# Patient Record
Sex: Female | Born: 1960 | Race: White | Hispanic: No | Marital: Married | Smoking: Never smoker
Health system: Southern US, Community
[De-identification: ages and names within clinical notes are randomized; demographics above are authoritative.]

## PROBLEM LIST (undated history)

## (undated) DIAGNOSIS — M329 Systemic lupus erythematosus, unspecified: Secondary | ICD-10-CM

## (undated) DIAGNOSIS — I10 Essential (primary) hypertension: Secondary | ICD-10-CM

## (undated) DIAGNOSIS — R05 Cough: Secondary | ICD-10-CM

## (undated) DIAGNOSIS — T148XXA Other injury of unspecified body region, initial encounter: Secondary | ICD-10-CM

## (undated) DIAGNOSIS — I509 Heart failure, unspecified: Secondary | ICD-10-CM

## (undated) DIAGNOSIS — R053 Chronic cough: Secondary | ICD-10-CM

## (undated) DIAGNOSIS — I609 Nontraumatic subarachnoid hemorrhage, unspecified: Secondary | ICD-10-CM

## (undated) DIAGNOSIS — N189 Chronic kidney disease, unspecified: Secondary | ICD-10-CM

## (undated) DIAGNOSIS — I4891 Unspecified atrial fibrillation: Secondary | ICD-10-CM

## (undated) DIAGNOSIS — E669 Obesity, unspecified: Secondary | ICD-10-CM

## (undated) HISTORY — DX: Nontraumatic subarachnoid hemorrhage, unspecified: I60.9

## (undated) HISTORY — DX: Other injury of unspecified body region, initial encounter: T14.8XXA

## (undated) HISTORY — DX: Obesity, unspecified: E66.9

## (undated) HISTORY — PX: CARDIOVERSION: SHX1299

## (undated) HISTORY — PX: HERNIA REPAIR: SHX51

## (undated) HISTORY — DX: Chronic kidney disease, unspecified: N18.9

## (undated) HISTORY — PX: EYE SURGERY: SHX253

## (undated) HISTORY — DX: Systemic lupus erythematosus, unspecified: M32.9

## (undated) HISTORY — DX: Unspecified atrial fibrillation: I48.91

## (undated) HISTORY — DX: Chronic cough: R05.3

## (undated) HISTORY — DX: Cough: R05

## (undated) HISTORY — DX: Heart failure, unspecified: I50.9

## (undated) HISTORY — DX: Essential (primary) hypertension: I10

## (undated) HISTORY — PX: OTHER SURGICAL HISTORY: SHX169

---

## 1998-02-03 ENCOUNTER — Encounter (HOSPITAL_COMMUNITY): Admission: RE | Admit: 1998-02-03 | Discharge: 1998-05-04 | Payer: Self-pay | Admitting: Nephrology

## 1998-04-26 ENCOUNTER — Ambulatory Visit (HOSPITAL_COMMUNITY): Admission: RE | Admit: 1998-04-26 | Discharge: 1998-04-26 | Payer: Self-pay | Admitting: Nephrology

## 1998-09-29 ENCOUNTER — Emergency Department (HOSPITAL_COMMUNITY): Admission: EM | Admit: 1998-09-29 | Discharge: 1998-09-29 | Payer: Self-pay | Admitting: Emergency Medicine

## 1998-12-31 ENCOUNTER — Encounter (HOSPITAL_COMMUNITY): Admission: RE | Admit: 1998-12-31 | Discharge: 1999-03-31 | Payer: Self-pay | Admitting: Nephrology

## 1999-03-20 ENCOUNTER — Encounter: Admission: RE | Admit: 1999-03-20 | Discharge: 1999-03-20 | Payer: Self-pay | Admitting: Nephrology

## 1999-03-25 ENCOUNTER — Encounter: Payer: Self-pay | Admitting: General Surgery

## 1999-03-25 ENCOUNTER — Ambulatory Visit (HOSPITAL_COMMUNITY): Admission: RE | Admit: 1999-03-25 | Discharge: 1999-03-25 | Payer: Self-pay | Admitting: *Deleted

## 1999-03-26 ENCOUNTER — Ambulatory Visit (HOSPITAL_COMMUNITY): Admission: RE | Admit: 1999-03-26 | Discharge: 1999-03-26 | Payer: Self-pay | Admitting: General Surgery

## 2000-05-11 ENCOUNTER — Ambulatory Visit (HOSPITAL_COMMUNITY): Admission: RE | Admit: 2000-05-11 | Discharge: 2000-05-11 | Payer: Self-pay | Admitting: Nephrology

## 2001-01-08 ENCOUNTER — Emergency Department (HOSPITAL_COMMUNITY): Admission: EM | Admit: 2001-01-08 | Discharge: 2001-01-08 | Payer: Self-pay | Admitting: *Deleted

## 2001-01-08 ENCOUNTER — Encounter: Payer: Self-pay | Admitting: Emergency Medicine

## 2003-07-11 ENCOUNTER — Inpatient Hospital Stay (HOSPITAL_COMMUNITY): Admission: AD | Admit: 2003-07-11 | Discharge: 2003-07-17 | Payer: Self-pay | Admitting: Nephrology

## 2003-07-12 ENCOUNTER — Encounter: Payer: Self-pay | Admitting: Cardiology

## 2004-02-23 ENCOUNTER — Emergency Department (HOSPITAL_COMMUNITY): Admission: EM | Admit: 2004-02-23 | Discharge: 2004-02-23 | Payer: Self-pay | Admitting: Emergency Medicine

## 2004-08-03 ENCOUNTER — Inpatient Hospital Stay (HOSPITAL_COMMUNITY): Admission: EM | Admit: 2004-08-03 | Discharge: 2004-08-08 | Payer: Self-pay | Admitting: Emergency Medicine

## 2004-08-04 ENCOUNTER — Ambulatory Visit: Payer: Self-pay | Admitting: Internal Medicine

## 2004-08-05 ENCOUNTER — Encounter: Payer: Self-pay | Admitting: Cardiology

## 2004-08-10 ENCOUNTER — Inpatient Hospital Stay (HOSPITAL_COMMUNITY): Admission: AD | Admit: 2004-08-10 | Discharge: 2004-08-13 | Payer: Self-pay | Admitting: Internal Medicine

## 2004-08-10 ENCOUNTER — Ambulatory Visit: Payer: Self-pay | Admitting: Internal Medicine

## 2004-08-17 ENCOUNTER — Ambulatory Visit: Payer: Self-pay | Admitting: Cardiology

## 2004-08-21 ENCOUNTER — Ambulatory Visit: Payer: Self-pay | Admitting: Cardiology

## 2004-08-25 ENCOUNTER — Ambulatory Visit: Payer: Self-pay | Admitting: Internal Medicine

## 2004-09-04 ENCOUNTER — Ambulatory Visit: Payer: Self-pay | Admitting: Cardiology

## 2004-09-17 ENCOUNTER — Ambulatory Visit: Payer: Self-pay | Admitting: Cardiology

## 2004-10-09 ENCOUNTER — Ambulatory Visit: Payer: Self-pay | Admitting: Cardiovascular Disease

## 2004-10-15 ENCOUNTER — Ambulatory Visit: Payer: Self-pay | Admitting: Internal Medicine

## 2004-10-16 ENCOUNTER — Ambulatory Visit (HOSPITAL_COMMUNITY): Admission: RE | Admit: 2004-10-16 | Discharge: 2004-10-16 | Payer: Self-pay | Admitting: Internal Medicine

## 2004-10-19 ENCOUNTER — Ambulatory Visit: Payer: Self-pay | Admitting: Internal Medicine

## 2004-10-23 ENCOUNTER — Ambulatory Visit: Payer: Self-pay

## 2004-10-30 ENCOUNTER — Ambulatory Visit: Payer: Self-pay | Admitting: Cardiology

## 2004-11-06 ENCOUNTER — Ambulatory Visit: Payer: Self-pay | Admitting: *Deleted

## 2004-11-13 ENCOUNTER — Ambulatory Visit: Payer: Self-pay | Admitting: Internal Medicine

## 2004-11-16 ENCOUNTER — Ambulatory Visit: Payer: Self-pay | Admitting: Internal Medicine

## 2004-12-07 ENCOUNTER — Ambulatory Visit: Payer: Self-pay | Admitting: Cardiology

## 2005-01-04 ENCOUNTER — Ambulatory Visit: Payer: Self-pay | Admitting: Cardiology

## 2005-01-18 ENCOUNTER — Ambulatory Visit: Payer: Self-pay | Admitting: Cardiology

## 2005-01-26 ENCOUNTER — Ambulatory Visit: Payer: Self-pay | Admitting: Internal Medicine

## 2005-01-28 ENCOUNTER — Ambulatory Visit: Payer: Self-pay | Admitting: Internal Medicine

## 2005-02-24 ENCOUNTER — Ambulatory Visit: Payer: Self-pay | Admitting: Cardiology

## 2005-03-08 ENCOUNTER — Ambulatory Visit: Payer: Self-pay | Admitting: Cardiology

## 2005-03-22 ENCOUNTER — Ambulatory Visit: Payer: Self-pay | Admitting: Internal Medicine

## 2005-04-01 ENCOUNTER — Ambulatory Visit: Payer: Self-pay | Admitting: Internal Medicine

## 2005-04-02 ENCOUNTER — Ambulatory Visit: Payer: Self-pay | Admitting: Cardiology

## 2005-04-12 ENCOUNTER — Ambulatory Visit: Payer: Self-pay | Admitting: Internal Medicine

## 2005-04-26 ENCOUNTER — Ambulatory Visit: Payer: Self-pay | Admitting: Cardiology

## 2005-04-27 ENCOUNTER — Emergency Department (HOSPITAL_COMMUNITY): Admission: EM | Admit: 2005-04-27 | Discharge: 2005-04-27 | Payer: Self-pay | Admitting: Emergency Medicine

## 2005-05-17 ENCOUNTER — Ambulatory Visit: Payer: Self-pay | Admitting: Cardiology

## 2005-06-03 ENCOUNTER — Ambulatory Visit: Payer: Self-pay | Admitting: *Deleted

## 2005-06-05 ENCOUNTER — Inpatient Hospital Stay (HOSPITAL_COMMUNITY): Admission: EM | Admit: 2005-06-05 | Discharge: 2005-06-07 | Payer: Self-pay | Admitting: Emergency Medicine

## 2005-06-17 ENCOUNTER — Ambulatory Visit: Payer: Self-pay | Admitting: Cardiology

## 2005-06-24 ENCOUNTER — Ambulatory Visit: Payer: Self-pay | Admitting: Cardiology

## 2005-07-01 ENCOUNTER — Ambulatory Visit: Payer: Self-pay | Admitting: Cardiology

## 2005-07-15 ENCOUNTER — Ambulatory Visit: Payer: Self-pay | Admitting: *Deleted

## 2005-07-30 ENCOUNTER — Ambulatory Visit: Payer: Self-pay | Admitting: Internal Medicine

## 2005-08-27 ENCOUNTER — Ambulatory Visit: Payer: Self-pay | Admitting: Cardiovascular Disease

## 2005-09-03 ENCOUNTER — Ambulatory Visit: Payer: Self-pay | Admitting: Cardiovascular Disease

## 2005-09-17 ENCOUNTER — Ambulatory Visit: Payer: Self-pay | Admitting: Internal Medicine

## 2005-09-17 ENCOUNTER — Ambulatory Visit: Payer: Self-pay | Admitting: Cardiology

## 2005-09-24 ENCOUNTER — Ambulatory Visit: Payer: Self-pay | Admitting: Cardiology

## 2005-10-04 ENCOUNTER — Ambulatory Visit: Payer: Self-pay | Admitting: Cardiology

## 2005-10-15 ENCOUNTER — Ambulatory Visit: Payer: Self-pay | Admitting: Internal Medicine

## 2005-11-16 ENCOUNTER — Ambulatory Visit: Payer: Self-pay | Admitting: *Deleted

## 2005-11-25 ENCOUNTER — Ambulatory Visit: Payer: Self-pay | Admitting: Cardiology

## 2005-12-09 ENCOUNTER — Ambulatory Visit: Payer: Self-pay | Admitting: Cardiology

## 2005-12-27 ENCOUNTER — Ambulatory Visit: Payer: Self-pay | Admitting: Cardiology

## 2006-02-01 ENCOUNTER — Ambulatory Visit: Payer: Self-pay | Admitting: *Deleted

## 2006-03-09 ENCOUNTER — Ambulatory Visit: Payer: Self-pay | Admitting: Internal Medicine

## 2006-04-06 ENCOUNTER — Ambulatory Visit: Payer: Self-pay | Admitting: Cardiology

## 2006-04-21 ENCOUNTER — Ambulatory Visit: Payer: Self-pay | Admitting: Internal Medicine

## 2006-04-27 ENCOUNTER — Ambulatory Visit: Payer: Self-pay | Admitting: Cardiology

## 2006-05-09 ENCOUNTER — Ambulatory Visit: Payer: Self-pay | Admitting: Internal Medicine

## 2006-06-01 ENCOUNTER — Ambulatory Visit: Payer: Self-pay | Admitting: Cardiology

## 2006-06-30 ENCOUNTER — Encounter (HOSPITAL_COMMUNITY): Admission: RE | Admit: 2006-06-30 | Discharge: 2006-09-20 | Payer: Self-pay | Admitting: Nephrology

## 2006-07-06 ENCOUNTER — Ambulatory Visit: Payer: Self-pay | Admitting: Internal Medicine

## 2006-07-15 ENCOUNTER — Ambulatory Visit: Payer: Self-pay | Admitting: *Deleted

## 2006-07-29 ENCOUNTER — Ambulatory Visit: Payer: Self-pay | Admitting: Internal Medicine

## 2006-08-30 ENCOUNTER — Ambulatory Visit: Payer: Self-pay | Admitting: Cardiology

## 2006-10-03 ENCOUNTER — Ambulatory Visit: Payer: Self-pay | Admitting: Cardiology

## 2006-11-07 ENCOUNTER — Ambulatory Visit: Payer: Self-pay | Admitting: Internal Medicine

## 2006-11-21 ENCOUNTER — Ambulatory Visit: Payer: Self-pay | Admitting: Cardiology

## 2006-12-12 ENCOUNTER — Ambulatory Visit: Payer: Self-pay | Admitting: Cardiovascular Disease

## 2006-12-26 ENCOUNTER — Ambulatory Visit: Payer: Self-pay | Admitting: Cardiology

## 2007-01-20 ENCOUNTER — Ambulatory Visit: Payer: Self-pay | Admitting: Cardiology

## 2007-02-03 ENCOUNTER — Ambulatory Visit: Payer: Self-pay | Admitting: Internal Medicine

## 2007-02-10 ENCOUNTER — Ambulatory Visit: Payer: Self-pay | Admitting: Internal Medicine

## 2007-02-14 ENCOUNTER — Ambulatory Visit: Payer: Self-pay | Admitting: Internal Medicine

## 2007-02-14 LAB — CONVERTED CEMR LAB
BUN: 35 mg/dL — ABNORMAL HIGH (ref 6–23)
Basophils Absolute: 0 10*3/uL (ref 0.0–0.1)
CO2: 25 meq/L (ref 19–32)
Chloride: 109 meq/L (ref 96–112)
Creatinine, Ser: 2.1 mg/dL — ABNORMAL HIGH (ref 0.4–1.2)
GFR calc Af Amer: 33 mL/min
GFR calc non Af Amer: 27 mL/min
Glucose, Bld: 108 mg/dL — ABNORMAL HIGH (ref 70–99)
HCT: 29.2 % — ABNORMAL LOW (ref 36.0–46.0)
INR: 3.4 — ABNORMAL HIGH (ref 0.8–1.0)
MCHC: 32.6 g/dL (ref 30.0–36.0)
MCV: 90.8 fL (ref 78.0–100.0)
Magnesium: 1.9 mg/dL (ref 1.5–2.5)
Monocytes Relative: 9.4 % (ref 3.0–11.0)
Neutro Abs: 11.3 10*3/uL — ABNORMAL HIGH (ref 1.4–7.7)
Neutrophils Relative %: 80.2 % — ABNORMAL HIGH (ref 43.0–77.0)
Platelets: 285 10*3/uL (ref 150–400)
Potassium: 4.2 meq/L (ref 3.5–5.1)
Prothrombin Time: 23.4 s — ABNORMAL HIGH (ref 10.9–13.3)
RBC: 3.22 M/uL — ABNORMAL LOW (ref 3.87–5.11)
Sodium: 141 meq/L (ref 135–145)

## 2007-02-15 ENCOUNTER — Ambulatory Visit (HOSPITAL_COMMUNITY): Admission: RE | Admit: 2007-02-15 | Discharge: 2007-02-15 | Payer: Self-pay | Admitting: Internal Medicine

## 2007-02-15 ENCOUNTER — Ambulatory Visit: Payer: Self-pay | Admitting: Internal Medicine

## 2007-02-27 ENCOUNTER — Ambulatory Visit: Payer: Self-pay | Admitting: Internal Medicine

## 2007-04-10 ENCOUNTER — Ambulatory Visit: Payer: Self-pay | Admitting: Cardiovascular Disease

## 2007-05-08 ENCOUNTER — Ambulatory Visit: Payer: Self-pay | Admitting: Internal Medicine

## 2007-06-05 ENCOUNTER — Ambulatory Visit: Payer: Self-pay | Admitting: Cardiology

## 2007-07-07 ENCOUNTER — Ambulatory Visit: Payer: Self-pay | Admitting: Cardiology

## 2007-08-03 ENCOUNTER — Ambulatory Visit: Payer: Self-pay | Admitting: Cardiology

## 2007-08-17 ENCOUNTER — Ambulatory Visit: Payer: Self-pay | Admitting: Cardiology

## 2007-08-31 ENCOUNTER — Ambulatory Visit: Payer: Self-pay | Admitting: Internal Medicine

## 2007-08-31 LAB — CONVERTED CEMR LAB
BUN: 43 mg/dL — ABNORMAL HIGH (ref 6–23)
Basophils Relative: 2.2 % — ABNORMAL HIGH (ref 0.0–1.0)
GFR calc Af Amer: 30 mL/min
GFR calc non Af Amer: 24 mL/min
HCT: 34.7 % — ABNORMAL LOW (ref 36.0–46.0)
Lymphocytes Relative: 10 % — ABNORMAL LOW (ref 12.0–46.0)
MCHC: 32.2 g/dL (ref 30.0–36.0)
Neutro Abs: 14 10*3/uL — ABNORMAL HIGH (ref 1.4–7.7)
Neutrophils Relative %: 86.7 % — ABNORMAL HIGH (ref 43.0–77.0)
Platelets: 222 10*3/uL (ref 150–400)
Potassium: 4.9 meq/L (ref 3.5–5.1)
Sodium: 142 meq/L (ref 135–145)
WBC: 16.2 10*3/uL — ABNORMAL HIGH (ref 4.5–10.5)

## 2007-09-01 ENCOUNTER — Ambulatory Visit (HOSPITAL_COMMUNITY): Admission: RE | Admit: 2007-09-01 | Discharge: 2007-09-01 | Payer: Self-pay | Admitting: Internal Medicine

## 2007-09-01 ENCOUNTER — Ambulatory Visit: Payer: Self-pay | Admitting: Internal Medicine

## 2007-09-14 ENCOUNTER — Ambulatory Visit: Payer: Self-pay | Admitting: Internal Medicine

## 2007-10-12 ENCOUNTER — Ambulatory Visit: Payer: Self-pay | Admitting: Internal Medicine

## 2007-10-20 ENCOUNTER — Ambulatory Visit: Payer: Self-pay | Admitting: Cardiology

## 2007-10-23 ENCOUNTER — Ambulatory Visit: Payer: Self-pay | Admitting: Internal Medicine

## 2007-10-23 ENCOUNTER — Ambulatory Visit (HOSPITAL_COMMUNITY): Admission: RE | Admit: 2007-10-23 | Discharge: 2007-10-23 | Payer: Self-pay | Admitting: Internal Medicine

## 2007-11-09 ENCOUNTER — Ambulatory Visit: Payer: Self-pay | Admitting: Internal Medicine

## 2007-11-17 ENCOUNTER — Ambulatory Visit: Payer: Self-pay | Admitting: Cardiology

## 2007-11-24 ENCOUNTER — Ambulatory Visit: Payer: Self-pay | Admitting: Internal Medicine

## 2007-11-24 ENCOUNTER — Ambulatory Visit: Payer: Self-pay

## 2007-12-07 ENCOUNTER — Ambulatory Visit: Payer: Self-pay | Admitting: Cardiology

## 2007-12-28 ENCOUNTER — Ambulatory Visit: Payer: Self-pay | Admitting: Internal Medicine

## 2008-01-04 ENCOUNTER — Ambulatory Visit: Payer: Self-pay | Admitting: Cardiology

## 2008-01-04 ENCOUNTER — Encounter: Admission: RE | Admit: 2008-01-04 | Discharge: 2008-01-04 | Payer: Self-pay | Admitting: Nephrology

## 2008-01-06 HISTORY — PX: OTHER SURGICAL HISTORY: SHX169

## 2008-01-18 ENCOUNTER — Ambulatory Visit: Payer: Self-pay | Admitting: Internal Medicine

## 2008-02-02 ENCOUNTER — Ambulatory Visit: Payer: Self-pay

## 2008-02-02 ENCOUNTER — Ambulatory Visit: Payer: Self-pay | Admitting: Cardiology

## 2008-02-08 ENCOUNTER — Ambulatory Visit: Payer: Self-pay | Admitting: Internal Medicine

## 2008-02-16 ENCOUNTER — Ambulatory Visit: Payer: Self-pay | Admitting: Internal Medicine

## 2008-02-26 ENCOUNTER — Ambulatory Visit: Payer: Self-pay | Admitting: Internal Medicine

## 2008-03-14 ENCOUNTER — Emergency Department (HOSPITAL_COMMUNITY): Admission: EM | Admit: 2008-03-14 | Discharge: 2008-03-14 | Payer: Self-pay | Admitting: Family Medicine

## 2008-03-19 ENCOUNTER — Ambulatory Visit: Payer: Self-pay | Admitting: Cardiovascular Disease

## 2008-03-22 ENCOUNTER — Ambulatory Visit: Payer: Self-pay | Admitting: Cardiology

## 2008-03-26 ENCOUNTER — Ambulatory Visit: Payer: Self-pay | Admitting: Internal Medicine

## 2008-04-03 ENCOUNTER — Ambulatory Visit: Payer: Self-pay | Admitting: Cardiology

## 2008-04-17 ENCOUNTER — Ambulatory Visit: Payer: Self-pay | Admitting: Internal Medicine

## 2008-04-30 ENCOUNTER — Ambulatory Visit: Payer: Self-pay | Admitting: Cardiology

## 2008-05-28 ENCOUNTER — Ambulatory Visit: Payer: Self-pay | Admitting: Internal Medicine

## 2008-06-19 ENCOUNTER — Ambulatory Visit: Payer: Self-pay | Admitting: Cardiology

## 2008-07-17 ENCOUNTER — Ambulatory Visit: Payer: Self-pay | Admitting: Cardiology

## 2008-08-05 ENCOUNTER — Ambulatory Visit: Payer: Self-pay | Admitting: Cardiology

## 2008-08-08 ENCOUNTER — Ambulatory Visit: Payer: Self-pay | Admitting: Cardiovascular Disease

## 2008-08-11 ENCOUNTER — Ambulatory Visit (HOSPITAL_COMMUNITY): Admission: RE | Admit: 2008-08-11 | Discharge: 2008-08-11 | Payer: Self-pay | Admitting: Emergency Medicine

## 2008-08-13 ENCOUNTER — Ambulatory Visit: Payer: Self-pay | Admitting: Internal Medicine

## 2008-08-13 ENCOUNTER — Encounter: Payer: Self-pay | Admitting: Internal Medicine

## 2008-08-13 ENCOUNTER — Ambulatory Visit: Payer: Self-pay | Admitting: Cardiology

## 2008-08-13 DIAGNOSIS — I10 Essential (primary) hypertension: Secondary | ICD-10-CM

## 2008-08-13 DIAGNOSIS — R05 Cough: Secondary | ICD-10-CM

## 2008-08-13 DIAGNOSIS — I4891 Unspecified atrial fibrillation: Secondary | ICD-10-CM

## 2008-08-20 ENCOUNTER — Ambulatory Visit: Payer: Self-pay | Admitting: Internal Medicine

## 2008-08-30 ENCOUNTER — Ambulatory Visit: Payer: Self-pay | Admitting: Pulmonary Disease

## 2008-08-30 DIAGNOSIS — M329 Systemic lupus erythematosus, unspecified: Secondary | ICD-10-CM | POA: Insufficient documentation

## 2008-09-12 ENCOUNTER — Ambulatory Visit: Payer: Self-pay | Admitting: Internal Medicine

## 2008-09-12 LAB — CONVERTED CEMR LAB: INR: 13.8 (ref 0.8–1.0)

## 2008-09-13 ENCOUNTER — Encounter: Payer: Self-pay | Admitting: Internal Medicine

## 2008-09-13 ENCOUNTER — Ambulatory Visit: Payer: Self-pay

## 2008-09-16 ENCOUNTER — Ambulatory Visit: Payer: Self-pay | Admitting: Internal Medicine

## 2008-09-16 LAB — CONVERTED CEMR LAB
ALT: 26 units/L (ref 0–35)
AST: 16 units/L (ref 0–37)
Alkaline Phosphatase: 52 units/L (ref 39–117)
BUN: 76 mg/dL — ABNORMAL HIGH (ref 6–23)
Bilirubin, Direct: 0 mg/dL (ref 0.0–0.3)
CO2: 22 meq/L (ref 19–32)
Chloride: 107 meq/L (ref 96–112)
Eosinophils Relative: 0.2 % (ref 0.0–5.0)
GFR calc non Af Amer: 10.65 mL/min (ref >60)
Glucose, Bld: 77 mg/dL (ref 70–99)
HCT: 26.8 % — ABNORMAL LOW (ref 36.0–46.0)
Hemoglobin: 8.7 g/dL — ABNORMAL LOW (ref 12.0–15.0)
MCHC: 32.5 g/dL (ref 30.0–36.0)
Monocytes Absolute: 0.5 10*3/uL (ref 0.1–1.0)
Neutro Abs: 8.6 10*3/uL — ABNORMAL HIGH (ref 1.4–7.7)
Neutrophils Relative %: 87.9 % — ABNORMAL HIGH (ref 43.0–77.0)
Platelets: 355 10*3/uL (ref 150.0–400.0)
Potassium: 4.4 meq/L (ref 3.5–5.1)
RBC: 2.95 M/uL — ABNORMAL LOW (ref 3.87–5.11)
RDW: 13.5 % (ref 11.5–14.6)
Total Protein: 7.1 g/dL (ref 6.0–8.3)
WBC: 9.8 10*3/uL (ref 4.5–10.5)

## 2008-09-18 ENCOUNTER — Encounter: Admission: RE | Admit: 2008-09-18 | Discharge: 2008-09-18 | Payer: Self-pay | Admitting: Nephrology

## 2008-09-20 ENCOUNTER — Ambulatory Visit: Payer: Self-pay | Admitting: Cardiology

## 2008-09-25 ENCOUNTER — Ambulatory Visit: Payer: Self-pay | Admitting: Pulmonary Disease

## 2008-09-27 ENCOUNTER — Ambulatory Visit: Payer: Self-pay | Admitting: Cardiology

## 2008-10-02 ENCOUNTER — Encounter: Payer: Self-pay | Admitting: Internal Medicine

## 2008-10-09 ENCOUNTER — Ambulatory Visit: Payer: Self-pay | Admitting: Cardiology

## 2008-10-16 ENCOUNTER — Encounter: Payer: Self-pay | Admitting: Internal Medicine

## 2008-10-16 ENCOUNTER — Ambulatory Visit: Payer: Self-pay | Admitting: Cardiology

## 2008-10-17 ENCOUNTER — Encounter (INDEPENDENT_AMBULATORY_CARE_PROVIDER_SITE_OTHER): Payer: Self-pay

## 2008-10-17 LAB — CONVERTED CEMR LAB
BUN: 32 mg/dL
Creatinine, Ser: 1.96 mg/dL
Potassium: 3.9 meq/L

## 2008-10-29 ENCOUNTER — Encounter (INDEPENDENT_AMBULATORY_CARE_PROVIDER_SITE_OTHER): Payer: Self-pay

## 2008-10-29 ENCOUNTER — Ambulatory Visit: Payer: Self-pay | Admitting: Cardiology

## 2008-11-01 ENCOUNTER — Encounter (INDEPENDENT_AMBULATORY_CARE_PROVIDER_SITE_OTHER): Payer: Self-pay | Admitting: *Deleted

## 2008-11-05 ENCOUNTER — Encounter: Payer: Self-pay | Admitting: *Deleted

## 2008-11-06 ENCOUNTER — Ambulatory Visit: Payer: Self-pay | Admitting: Cardiology

## 2008-11-06 LAB — CONVERTED CEMR LAB
POC INR: 2.9
Protime: 20.6

## 2008-11-13 ENCOUNTER — Telehealth: Payer: Self-pay | Admitting: Internal Medicine

## 2008-11-13 DIAGNOSIS — N259 Disorder resulting from impaired renal tubular function, unspecified: Secondary | ICD-10-CM | POA: Insufficient documentation

## 2008-11-20 ENCOUNTER — Encounter (INDEPENDENT_AMBULATORY_CARE_PROVIDER_SITE_OTHER): Payer: Self-pay | Admitting: Cardiology

## 2008-11-20 ENCOUNTER — Ambulatory Visit: Payer: Self-pay | Admitting: Internal Medicine

## 2008-12-11 ENCOUNTER — Encounter: Payer: Self-pay | Admitting: *Deleted

## 2008-12-12 ENCOUNTER — Ambulatory Visit: Payer: Self-pay | Admitting: Cardiology

## 2008-12-12 LAB — CONVERTED CEMR LAB
POC INR: 5.2
Prothrombin Time: 27.7 s

## 2008-12-18 ENCOUNTER — Ambulatory Visit: Payer: Self-pay

## 2008-12-18 ENCOUNTER — Encounter: Payer: Self-pay | Admitting: Internal Medicine

## 2008-12-18 ENCOUNTER — Ambulatory Visit: Payer: Self-pay | Admitting: Cardiology

## 2008-12-18 LAB — CONVERTED CEMR LAB: POC INR: 2.3

## 2008-12-27 ENCOUNTER — Telehealth (INDEPENDENT_AMBULATORY_CARE_PROVIDER_SITE_OTHER): Payer: Self-pay | Admitting: *Deleted

## 2009-01-01 ENCOUNTER — Ambulatory Visit: Payer: Self-pay | Admitting: Cardiovascular Disease

## 2009-01-01 LAB — CONVERTED CEMR LAB
POC INR: 2.1
Prothrombin Time: 21.8 s
Prothrombin Time: 21.8 s — ABNORMAL HIGH (ref 10.9–13.3)

## 2009-01-06 ENCOUNTER — Encounter: Payer: Self-pay | Admitting: Internal Medicine

## 2009-01-14 ENCOUNTER — Encounter: Payer: Self-pay | Admitting: Cardiology

## 2009-02-14 ENCOUNTER — Ambulatory Visit: Payer: Self-pay | Admitting: Internal Medicine

## 2009-03-04 ENCOUNTER — Ambulatory Visit: Payer: Self-pay | Admitting: Internal Medicine

## 2009-03-11 ENCOUNTER — Encounter: Payer: Self-pay | Admitting: Internal Medicine

## 2009-03-13 ENCOUNTER — Telehealth: Payer: Self-pay | Admitting: Internal Medicine

## 2009-03-14 ENCOUNTER — Ambulatory Visit: Payer: Self-pay | Admitting: Internal Medicine

## 2009-03-14 DIAGNOSIS — R5381 Other malaise: Secondary | ICD-10-CM

## 2009-03-14 DIAGNOSIS — R5383 Other fatigue: Secondary | ICD-10-CM

## 2009-03-14 LAB — CONVERTED CEMR LAB
Anti Nuclear Antibody(ANA): NEGATIVE
Bilirubin Urine: NEGATIVE
Ketones, ur: NEGATIVE mg/dL
Nitrite: NEGATIVE
Protein, ur: 100 mg/dL — AB
Specific Gravity, Urine: 1.014 (ref 1.005–1.030)

## 2009-03-18 ENCOUNTER — Telehealth: Payer: Self-pay | Admitting: Internal Medicine

## 2009-03-18 ENCOUNTER — Encounter: Payer: Self-pay | Admitting: Internal Medicine

## 2009-03-20 ENCOUNTER — Observation Stay (HOSPITAL_COMMUNITY): Admission: AD | Admit: 2009-03-20 | Discharge: 2009-03-20 | Payer: Self-pay | Admitting: Internal Medicine

## 2009-03-20 LAB — CONVERTED CEMR LAB
Calcium: 9.4 mg/dL (ref 8.4–10.5)
Eosinophils Absolute: 0.1 10*3/uL (ref 0.0–0.7)
Glucose, Bld: 91 mg/dL (ref 70–99)
HCT: 21.2 % — CL (ref 36.0–46.0)
Lymphs Abs: 1.3 10*3/uL (ref 0.7–4.0)
MCHC: 31.9 g/dL (ref 30.0–36.0)
Monocytes Absolute: 0.7 10*3/uL (ref 0.1–1.0)
Neutro Abs: 11.7 10*3/uL — ABNORMAL HIGH (ref 1.4–7.7)
Platelets: 343 10*3/uL (ref 150.0–400.0)
RBC: 2.29 M/uL — ABNORMAL LOW (ref 3.87–5.11)
Sodium: 135 meq/L (ref 135–145)
T3, Free: 1.9 pg/mL — ABNORMAL LOW (ref 2.3–4.2)
WBC: 13.8 10*3/uL — ABNORMAL HIGH (ref 4.5–10.5)

## 2009-03-25 ENCOUNTER — Encounter: Payer: Self-pay | Admitting: Internal Medicine

## 2009-04-02 ENCOUNTER — Telehealth: Payer: Self-pay | Admitting: Internal Medicine

## 2009-04-15 ENCOUNTER — Encounter: Payer: Self-pay | Admitting: Internal Medicine

## 2009-04-29 ENCOUNTER — Telehealth: Payer: Self-pay | Admitting: Internal Medicine

## 2009-05-13 ENCOUNTER — Encounter: Payer: Self-pay | Admitting: Internal Medicine

## 2009-05-16 ENCOUNTER — Encounter: Payer: Self-pay | Admitting: Internal Medicine

## 2009-05-20 ENCOUNTER — Telehealth: Payer: Self-pay | Admitting: Internal Medicine

## 2009-05-24 ENCOUNTER — Inpatient Hospital Stay (HOSPITAL_COMMUNITY): Admission: EM | Admit: 2009-05-24 | Discharge: 2009-05-30 | Payer: Self-pay | Admitting: Family Medicine

## 2009-05-24 ENCOUNTER — Ambulatory Visit: Payer: Self-pay | Admitting: Family Medicine

## 2009-05-24 ENCOUNTER — Ambulatory Visit: Payer: Self-pay | Admitting: Internal Medicine

## 2009-05-28 ENCOUNTER — Encounter: Payer: Self-pay | Admitting: Internal Medicine

## 2009-06-02 ENCOUNTER — Ambulatory Visit: Payer: Self-pay | Admitting: Internal Medicine

## 2009-06-02 LAB — CONVERTED CEMR LAB: POC INR: 3.1

## 2009-06-03 LAB — CONVERTED CEMR LAB
BUN: 42 mg/dL — ABNORMAL HIGH (ref 6–23)
CO2: 21 meq/L (ref 19–32)
Calcium: 9.7 mg/dL (ref 8.4–10.5)
Chloride: 106 meq/L (ref 96–112)
Creatinine, Ser: 2.6 mg/dL — ABNORMAL HIGH (ref 0.4–1.2)
Iron: 78 ug/dL (ref 42–145)
Potassium: 3.3 meq/L — ABNORMAL LOW (ref 3.5–5.1)
Saturation Ratios: 45.3 % (ref 20.0–50.0)
Sodium: 137 meq/L (ref 135–145)

## 2009-06-04 ENCOUNTER — Ambulatory Visit: Payer: Self-pay | Admitting: Cardiology

## 2009-06-04 ENCOUNTER — Encounter (INDEPENDENT_AMBULATORY_CARE_PROVIDER_SITE_OTHER): Payer: Self-pay | Admitting: Cardiology

## 2009-06-04 LAB — CONVERTED CEMR LAB: POC INR: 2.2

## 2009-06-07 HISTORY — PX: OTHER SURGICAL HISTORY: SHX169

## 2009-06-09 ENCOUNTER — Inpatient Hospital Stay (HOSPITAL_COMMUNITY): Admission: EM | Admit: 2009-06-09 | Discharge: 2009-07-05 | Payer: Self-pay | Admitting: Emergency Medicine

## 2009-06-09 ENCOUNTER — Ambulatory Visit: Payer: Self-pay | Admitting: Cardiology

## 2009-06-09 ENCOUNTER — Ambulatory Visit: Payer: Self-pay | Admitting: Cardiovascular Disease

## 2009-06-10 ENCOUNTER — Encounter: Payer: Self-pay | Admitting: Internal Medicine

## 2009-06-11 ENCOUNTER — Ambulatory Visit: Payer: Self-pay | Admitting: Internal Medicine

## 2009-06-11 ENCOUNTER — Ambulatory Visit: Payer: Self-pay | Admitting: Vascular Surgery

## 2009-07-03 ENCOUNTER — Encounter: Payer: Self-pay | Admitting: Internal Medicine

## 2009-07-16 ENCOUNTER — Encounter: Payer: Self-pay | Admitting: Internal Medicine

## 2009-07-31 ENCOUNTER — Telehealth (INDEPENDENT_AMBULATORY_CARE_PROVIDER_SITE_OTHER): Payer: Self-pay | Admitting: *Deleted

## 2009-07-31 ENCOUNTER — Encounter: Payer: Self-pay | Admitting: Internal Medicine

## 2009-08-01 ENCOUNTER — Ambulatory Visit (HOSPITAL_COMMUNITY): Admission: RE | Admit: 2009-08-01 | Discharge: 2009-08-01 | Payer: Self-pay | Admitting: Plastic Surgery

## 2009-08-13 ENCOUNTER — Ambulatory Visit: Payer: Self-pay | Admitting: Internal Medicine

## 2009-08-27 ENCOUNTER — Inpatient Hospital Stay (HOSPITAL_COMMUNITY): Admission: RE | Admit: 2009-08-27 | Discharge: 2009-09-01 | Payer: Self-pay | Admitting: Plastic Surgery

## 2009-09-17 ENCOUNTER — Encounter: Payer: Self-pay | Admitting: Internal Medicine

## 2009-10-08 ENCOUNTER — Encounter: Payer: Self-pay | Admitting: Internal Medicine

## 2009-10-29 ENCOUNTER — Encounter: Payer: Self-pay | Admitting: Internal Medicine

## 2009-12-12 ENCOUNTER — Telehealth (INDEPENDENT_AMBULATORY_CARE_PROVIDER_SITE_OTHER): Payer: Self-pay | Admitting: *Deleted

## 2010-01-19 ENCOUNTER — Ambulatory Visit: Payer: Self-pay | Admitting: Internal Medicine

## 2010-01-19 DIAGNOSIS — R609 Edema, unspecified: Secondary | ICD-10-CM

## 2010-02-16 ENCOUNTER — Encounter: Payer: Self-pay | Admitting: Internal Medicine

## 2010-02-19 ENCOUNTER — Encounter: Payer: Self-pay | Admitting: Internal Medicine

## 2010-02-26 ENCOUNTER — Ambulatory Visit: Payer: Self-pay

## 2010-02-26 ENCOUNTER — Encounter: Payer: Self-pay | Admitting: Cardiovascular Disease

## 2010-04-10 ENCOUNTER — Encounter: Admission: RE | Admit: 2010-04-10 | Discharge: 2010-04-10 | Payer: Self-pay | Admitting: Otolaryngology

## 2010-05-21 ENCOUNTER — Encounter: Payer: Self-pay | Admitting: Internal Medicine

## 2010-06-08 ENCOUNTER — Inpatient Hospital Stay (HOSPITAL_COMMUNITY)
Admission: EM | Admit: 2010-06-08 | Discharge: 2010-06-15 | Payer: Self-pay | Source: Home / Self Care | Attending: Internal Medicine | Admitting: Internal Medicine

## 2010-06-10 LAB — COMPREHENSIVE METABOLIC PANEL
ALT: 14 U/L (ref 0–35)
AST: 19 U/L (ref 0–37)
Albumin: 2.6 g/dL — ABNORMAL LOW (ref 3.5–5.2)
Alkaline Phosphatase: 61 U/L (ref 39–117)
BUN: 59 mg/dL — ABNORMAL HIGH (ref 6–23)
CO2: 23 mEq/L (ref 19–32)
Calcium: 10 mg/dL (ref 8.4–10.5)
Chloride: 109 mEq/L (ref 96–112)
Creatinine, Ser: 3.06 mg/dL — ABNORMAL HIGH (ref 0.4–1.2)
GFR calc Af Amer: 20 mL/min — ABNORMAL LOW (ref >60)
GFR calc non Af Amer: 16 mL/min — ABNORMAL LOW (ref >60)
Glucose, Bld: 101 mg/dL — ABNORMAL HIGH (ref 70–99)
Potassium: 3.9 mEq/L (ref 3.5–5.1)
Sodium: 142 mEq/L (ref 135–145)
Total Bilirubin: 0.5 mg/dL (ref 0.3–1.2)
Total Protein: 6.1 g/dL (ref 6.0–8.3)

## 2010-06-10 LAB — MAGNESIUM: Magnesium: 2.3 mg/dL (ref 1.5–2.5)

## 2010-06-10 LAB — CBC
HCT: 27.6 % — ABNORMAL LOW (ref 36.0–46.0)
Hemoglobin: 8.7 g/dL — ABNORMAL LOW (ref 12.0–15.0)
MCH: 29.9 pg (ref 26.0–34.0)
MCHC: 31.5 g/dL (ref 30.0–36.0)
MCV: 94.8 fL (ref 78.0–100.0)
Platelets: 282 10*3/uL (ref 150–400)
RBC: 2.91 MIL/uL — ABNORMAL LOW (ref 3.87–5.11)
RDW: 14.2 % (ref 11.5–15.5)
WBC: 12.5 10*3/uL — ABNORMAL HIGH (ref 4.0–10.5)

## 2010-06-10 LAB — BRAIN NATRIURETIC PEPTIDE: Pro B Natriuretic peptide (BNP): 553 pg/mL — ABNORMAL HIGH (ref 0.0–100.0)

## 2010-06-10 LAB — PHOSPHORUS: Phosphorus: 4 mg/dL (ref 2.3–4.6)

## 2010-06-11 LAB — COMPREHENSIVE METABOLIC PANEL
ALT: 14 U/L (ref 0–35)
AST: 11 U/L (ref 0–37)
Albumin: 2.8 g/dL — ABNORMAL LOW (ref 3.5–5.2)
Alkaline Phosphatase: 61 U/L (ref 39–117)
BUN: 56 mg/dL — ABNORMAL HIGH (ref 6–23)
CO2: 23 mEq/L (ref 19–32)
Calcium: 9.7 mg/dL (ref 8.4–10.5)
Chloride: 109 mEq/L (ref 96–112)
Creatinine, Ser: 3.11 mg/dL — ABNORMAL HIGH (ref 0.4–1.2)
GFR calc Af Amer: 19 mL/min — ABNORMAL LOW (ref >60)
GFR calc non Af Amer: 16 mL/min — ABNORMAL LOW (ref >60)
Glucose, Bld: 112 mg/dL — ABNORMAL HIGH (ref 70–99)
Potassium: 3.5 mEq/L (ref 3.5–5.1)
Sodium: 141 mEq/L (ref 135–145)
Total Bilirubin: 0.5 mg/dL (ref 0.3–1.2)
Total Protein: 6 g/dL (ref 6.0–8.3)

## 2010-06-11 LAB — BRAIN NATRIURETIC PEPTIDE: Pro B Natriuretic peptide (BNP): 1071 pg/mL — ABNORMAL HIGH (ref 0.0–100.0)

## 2010-06-11 LAB — CBC
HCT: 27 % — ABNORMAL LOW (ref 36.0–46.0)
Hemoglobin: 8.3 g/dL — ABNORMAL LOW (ref 12.0–15.0)
MCH: 29.1 pg (ref 26.0–34.0)
MCHC: 30.7 g/dL (ref 30.0–36.0)
MCV: 94.7 fL (ref 78.0–100.0)
Platelets: 262 10*3/uL (ref 150–400)
RBC: 2.85 MIL/uL — ABNORMAL LOW (ref 3.87–5.11)
RDW: 14.2 % (ref 11.5–15.5)
WBC: 10.8 10*3/uL — ABNORMAL HIGH (ref 4.0–10.5)

## 2010-06-11 LAB — MAGNESIUM: Magnesium: 2.2 mg/dL (ref 1.5–2.5)

## 2010-06-12 LAB — DIFFERENTIAL
Basophils Absolute: 0 10*3/uL (ref 0.0–0.1)
Basophils Relative: 0 % (ref 0–1)
Eosinophils Absolute: 0.1 10*3/uL (ref 0.0–0.7)
Eosinophils Relative: 1 % (ref 0–5)
Lymphocytes Relative: 6 % — ABNORMAL LOW (ref 12–46)
Lymphs Abs: 0.6 10*3/uL — ABNORMAL LOW (ref 0.7–4.0)
Monocytes Absolute: 0.8 10*3/uL (ref 0.1–1.0)
Monocytes Relative: 8 % (ref 3–12)
Neutro Abs: 9.4 10*3/uL — ABNORMAL HIGH (ref 1.7–7.7)
Neutrophils Relative %: 86 % — ABNORMAL HIGH (ref 43–77)

## 2010-06-12 LAB — COMPREHENSIVE METABOLIC PANEL
ALT: 14 U/L (ref 0–35)
AST: 14 U/L (ref 0–37)
Albumin: 2.9 g/dL — ABNORMAL LOW (ref 3.5–5.2)
Alkaline Phosphatase: 64 U/L (ref 39–117)
BUN: 52 mg/dL — ABNORMAL HIGH (ref 6–23)
CO2: 17 mEq/L — ABNORMAL LOW (ref 19–32)
Calcium: 10 mg/dL (ref 8.4–10.5)
Chloride: 108 mEq/L (ref 96–112)
Creatinine, Ser: 3.2 mg/dL — ABNORMAL HIGH (ref 0.4–1.2)
GFR calc Af Amer: 19 mL/min — ABNORMAL LOW (ref >60)
GFR calc non Af Amer: 16 mL/min — ABNORMAL LOW (ref >60)
Glucose, Bld: 95 mg/dL (ref 70–99)
Potassium: 3.7 mEq/L (ref 3.5–5.1)
Sodium: 141 mEq/L (ref 135–145)
Total Bilirubin: 0.5 mg/dL (ref 0.3–1.2)
Total Protein: 6.1 g/dL (ref 6.0–8.3)

## 2010-06-12 LAB — CBC
HCT: 29.5 % — ABNORMAL LOW (ref 36.0–46.0)
HCT: 30 % — ABNORMAL LOW (ref 36.0–46.0)
Hemoglobin: 9 g/dL — ABNORMAL LOW (ref 12.0–15.0)
Hemoglobin: 9.2 g/dL — ABNORMAL LOW (ref 12.0–15.0)
MCH: 29.1 pg (ref 26.0–34.0)
MCH: 29.3 pg (ref 26.0–34.0)
MCHC: 30.5 g/dL (ref 30.0–36.0)
MCHC: 30.7 g/dL (ref 30.0–36.0)
MCV: 95.5 fL (ref 78.0–100.0)
MCV: 95.5 fL (ref 78.0–100.0)
Platelets: 305 10*3/uL (ref 150–400)
Platelets: 307 10*3/uL (ref 150–400)
RBC: 3.09 MIL/uL — ABNORMAL LOW (ref 3.87–5.11)
RBC: 3.14 MIL/uL — ABNORMAL LOW (ref 3.87–5.11)
RDW: 14.3 % (ref 11.5–15.5)
RDW: 14.5 % (ref 11.5–15.5)
WBC: 11 10*3/uL — ABNORMAL HIGH (ref 4.0–10.5)
WBC: 11 10*3/uL — ABNORMAL HIGH (ref 4.0–10.5)

## 2010-06-12 LAB — BRAIN NATRIURETIC PEPTIDE: Pro B Natriuretic peptide (BNP): 1754 pg/mL — ABNORMAL HIGH (ref 0.0–100.0)

## 2010-06-12 LAB — MAGNESIUM: Magnesium: 2.3 mg/dL (ref 1.5–2.5)

## 2010-06-19 ENCOUNTER — Telehealth: Payer: Self-pay | Admitting: Internal Medicine

## 2010-06-22 LAB — BASIC METABOLIC PANEL
BUN: 57 mg/dL — ABNORMAL HIGH (ref 6–23)
BUN: 60 mg/dL — ABNORMAL HIGH (ref 6–23)
CO2: 20 mEq/L (ref 19–32)
CO2: 20 mEq/L (ref 19–32)
Calcium: 9.6 mg/dL (ref 8.4–10.5)
Calcium: 9.4 mg/dL (ref 8.4–10.5)
Chloride: 107 mEq/L (ref 96–112)
Chloride: 108 mEq/L (ref 96–112)
Creatinine, Ser: 3.08 mg/dL — ABNORMAL HIGH (ref 0.4–1.2)
Creatinine, Ser: 3.03 mg/dL — ABNORMAL HIGH (ref 0.4–1.2)
GFR calc Af Amer: 20 mL/min — ABNORMAL LOW (ref >60)
GFR calc Af Amer: 20 mL/min — ABNORMAL LOW (ref >60)
GFR calc non Af Amer: 16 mL/min — ABNORMAL LOW (ref >60)
GFR calc non Af Amer: 17 mL/min — ABNORMAL LOW (ref >60)
Glucose, Bld: 95 mg/dL (ref 70–99)
Glucose, Bld: 106 mg/dL — ABNORMAL HIGH (ref 70–99)
Potassium: 3.5 mEq/L (ref 3.5–5.1)
Potassium: 3.4 mEq/L — ABNORMAL LOW (ref 3.5–5.1)
Sodium: 137 mEq/L (ref 135–145)
Sodium: 138 mEq/L (ref 135–145)

## 2010-06-22 LAB — COMPREHENSIVE METABOLIC PANEL
ALT: 11 U/L (ref 0–35)
AST: 12 U/L (ref 0–37)
Albumin: 2.7 g/dL — ABNORMAL LOW (ref 3.5–5.2)
Alkaline Phosphatase: 63 U/L (ref 39–117)
BUN: 55 mg/dL — ABNORMAL HIGH (ref 6–23)
CO2: 21 mEq/L (ref 19–32)
Calcium: 10.1 mg/dL (ref 8.4–10.5)
Chloride: 107 mEq/L (ref 96–112)
Creatinine, Ser: 3.36 mg/dL — ABNORMAL HIGH (ref 0.4–1.2)
GFR calc Af Amer: 18 mL/min — ABNORMAL LOW (ref >60)
GFR calc non Af Amer: 15 mL/min — ABNORMAL LOW (ref >60)
Glucose, Bld: 109 mg/dL — ABNORMAL HIGH (ref 70–99)
Potassium: 3.4 mEq/L — ABNORMAL LOW (ref 3.5–5.1)
Sodium: 140 mEq/L (ref 135–145)
Total Bilirubin: 0.6 mg/dL (ref 0.3–1.2)
Total Protein: 5.9 g/dL — ABNORMAL LOW (ref 6.0–8.3)

## 2010-06-22 LAB — MAGNESIUM: Magnesium: 2.2 mg/dL (ref 1.5–2.5)

## 2010-06-23 ENCOUNTER — Encounter: Payer: Self-pay | Admitting: Physician Assistant

## 2010-06-23 ENCOUNTER — Ambulatory Visit: Admission: RE | Admit: 2010-06-23 | Discharge: 2010-06-23 | Payer: Self-pay | Source: Home / Self Care

## 2010-06-23 DIAGNOSIS — I5030 Unspecified diastolic (congestive) heart failure: Secondary | ICD-10-CM | POA: Insufficient documentation

## 2010-06-29 ENCOUNTER — Encounter: Payer: Self-pay | Admitting: Internal Medicine

## 2010-07-01 ENCOUNTER — Telehealth: Payer: Self-pay | Admitting: Internal Medicine

## 2010-07-05 LAB — CONVERTED CEMR LAB
ALT: 12 units/L (ref 0–35)
AST: 11 units/L (ref 0–37)
Albumin: 3.5 g/dL (ref 3.5–5.2)
Alkaline Phosphatase: 62 units/L (ref 39–117)
BUN: 50 mg/dL — ABNORMAL HIGH (ref 6–23)
CO2: 25 meq/L (ref 19–32)
Calcium: 9.7 mg/dL (ref 8.4–10.5)
Chloride: 103 meq/L (ref 96–112)
Protime: 20.1
Total Bilirubin: 0.8 mg/dL (ref 0.3–1.2)

## 2010-07-07 ENCOUNTER — Encounter: Payer: Self-pay | Admitting: Internal Medicine

## 2010-07-07 NOTE — Miscellaneous (Signed)
Summary: Orders Update  Clinical Lists Changes  Orders: Added new Test order of Venous Duplex Lower Extremity (Venous Duplex Lower) - Signed 
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
## 2010-07-07 NOTE — Letter (Signed)
Summary: [facility] Patient Note   [facility] Patient Note   Imported By: Roderic Ovens 12/30/2009 16:08:42  _____________________________________________________________________  External Attachment:    Type:   Image     Comment:   External Document

## 2010-07-07 NOTE — Progress Notes (Signed)
Summary: refill request - metoprolol  Phone Note Refill Request Message from:  Patient on December 12, 2009 8:10 AM  Refills Requested: Medication #1:  METOPROLOL SUCCINATE 100 MG XR24H-TAB take one tablet two times a day pt taking med twice a day now, so ran out early, pls call to [facility] [address]   Method Requested: Telephone to Pharmacy Initial call taken by: Glynda Jaeger,  December 12, 2009 8:10 AM    Prescriptions: METOPROLOL SUCCINATE 100 MG XR24H-TAB (METOPROLOL SUCCINATE) take one tablet two times a day  #180 x 2   Entered by:   Hardin Negus, RMA   Authorized by:   Dolores Patty, MD, [facility]   Signed by:   Hardin Negus, RMA on 12/12/2009   Method used:   Electronically to        [id number]* (retail)       [address] [place], [place]  [address]       Ph: [phone]       Fax: [phone]   RxID:   [id number]   Appended Document: refill request - metoprolol    Clinical Lists Changes  Medications: Rx of METOPROLOL SUCCINATE 100 MG XR24H-TAB (METOPROLOL SUCCINATE) take one tablet two times a day;  #180 x 2;  Signed;  Entered by: Hardin Negus, RMA;  Authorized by: Dolores Patty, MD, [facility];  Method used: Electronically to [facility]*, , ,   , Ph: [phone], Fax: [phone]    Prescriptions: METOPROLOL SUCCINATE 100 MG XR24H-TAB (METOPROLOL SUCCINATE) take one tablet two times a day  #180 x 2   Entered by:   Hardin Negus, RMA   Authorized by:   Dolores Patty, MD, [facility]   Signed by:   Hardin Negus, RMA on 12/16/2009   Method used:   Electronically to        [facility]* (retail)             ,          Ph: [phone]       Fax: [phone]   RxID:   [id number]

## 2010-07-07 NOTE — Letter (Signed)
Summary: [facility] Patient Note   [facility] Patient Note   Imported By: Roderic Ovens 03/20/2010 12:21:12  _____________________________________________________________________  External Attachment:    Type:   Image     Comment:   External Document

## 2010-07-07 NOTE — Medication Information (Signed)
Summary: Coumadin Clinic  Anticoagulant Therapy  Managed by: Inactive Referring MD: Shirlee Limerick PCP: Marina Gravel Supervising MD: Graciela Husbands MD, Viviann Spare Indication 1: Atrial Fibrillation (ICD-427.31) Lab Used: [facility] Site: [facility] INR RANGE 2 - 3          Comments: Pt is off coumadin, in [facility] s/p leg injury d/t fall on coumadin and supratherapeutic INR. Cloyde Reams RN  July 03, 2009 12:08 PM   Allergies: 1)  ! Compazine  Anticoagulation Management History:      Positive risk factors for bleeding include presence of serious comorbidities.  Negative risk factors for bleeding include an age less than 60 years old.  The bleeding index is 'intermediate risk'.  Positive CHADS2 values include History of HTN.  Negative CHADS2 values include Age > 62 years old.  The start date was 07/12/2003.  Her last INR was 2.1 ratio.  Anticoagulation responsible provider: Graciela Husbands MD, Viviann Spare.  Exp: 07/2010.    Anticoagulation Management Assessment/Plan:      The target INR is 2 - 3.  The next INR is due 06/09/2009.  Anticoagulation instructions were given to patient.  Results were reviewed/authorized by Inactive.         Prior Anticoagulation Instructions: INR 2.2  Take 1 tab = 1 mg daily except Friday take 0.5 tab = 0.5 mg  Recheck on 06/09/2009.
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
## 2010-07-07 NOTE — Letter (Signed)
Summary: [name] - Medication Order  [name] - Medication Order   Imported By: Marylou Mccoy 12/11/2009 10:22:00  _____________________________________________________________________  External Attachment:    Type:   Image     Comment:   External Document

## 2010-07-07 NOTE — Progress Notes (Signed)
  Faxed all Cardiac over to [facility] to fax [phone] [facility]  July 31, 2009 11:18 AM

## 2010-07-07 NOTE — Consult Note (Signed)
[facility]   Imported By: Roderic Ovens 06/18/2009 16:19:38  _____________________________________________________________________  External Attachment:    Type:   Image     Comment:   External Document

## 2010-07-07 NOTE — Letter (Signed)
Summary: [facility] Office Note  [facility] Office Note   Imported By: Roderic Ovens 10/08/2009 15:26:53  _____________________________________________________________________  External Attachment:    Type:   Image     Comment:   External Document

## 2010-07-07 NOTE — Medication Information (Signed)
Summary: rov.m  Anticoagulant Therapy  Managed by: Shelby Dubin, PharmD, BCPS, CPP Referring MD: Shirlee Limerick PCP: Marina Gravel Supervising MD: Riley Kill MD, Maisie Fus Indication 1: Atrial Fibrillation (ICD-427.31) Lab Used: [facility] Site: [facility] INR RANGE 2 - 3          Comments: Patient tripped over herself in Coumadin clinic.  Floor was ok, and not wet.  She fell and hit left knee.  She also hit her head.  She is neurologically intact.  Her left temple hurts at site of trauma, but there is no focal evidence of either trauma, or neurologic change.  She has hematoma around knee was not present, but is currently present.  She is alert and oriented.  We will arrange transfer to ER, as she will likely need a CT scan and closer neurologic monitoring.  BP was 128/72.  She denies syncope.  She remains alert.  Her INR is 3.5, so she is going to ER with EMS.  Neck is supple, and not impacted.  Bonnee Quin, MD  Allergies: 1)  ! Compazine  Anticoagulation Management History:      Positive risk factors for bleeding include presence of serious comorbidities.  Negative risk factors for bleeding include an age less than 11 years old.  The bleeding index is 'intermediate risk'.  Positive CHADS2 values include History of HTN.  Negative CHADS2 values include Age > 53 years old.  The start date was 07/12/2003.  Her last INR was 2.1 ratio.  Anticoagulation responsible provider: Riley Kill MD, Maisie Fus.  Exp: 07/2010.    Anticoagulation Management Assessment/Plan:      The target INR is 2 - 3.  The next INR is due 06/09/2009.  Anticoagulation instructions were given to patient.  Results were reviewed/authorized by Shelby Dubin, PharmD, BCPS, CPP.         Prior Anticoagulation Instructions: INR 2.2  Take 1 tab = 1 mg daily except Friday take 0.5 tab = 0.5 mg  Recheck on 06/09/2009.

## 2010-07-07 NOTE — Letter (Signed)
Summary: [facility] Office Note  [facility] Office Note   Imported By: Roderic Ovens 08/18/2009 15:49:44  _____________________________________________________________________  External Attachment:    Type:   Image     Comment:   External Document
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
## 2010-07-09 NOTE — Progress Notes (Signed)
Summary: pt feels lousy/heart rate low  Phone Note Call from Patient   Caller: Patient [phone] Reason for Call: Talk to Nurse Summary of Call: pt was in a-fib went to hospital given more meds-now seems like heart rate low-and feels lousy-pls advise Initial call taken by: Glynda Jaeger,  June 19, 2010 3:29 PM  Follow-up for Phone Call        Phone Call Completed PER DR HOCHREIN  DECREASE TOPROL XL TO 100 MG two times a day  AND DILTIAZEM ER 300 MG 1 once daily AND TO SEE SCOTT WEAVER PA. PT AWARE. APPT SDCHEDULED WITH SCOTT  FOr 06/23/10 at 9:30 AM Follow-up by: Scherrie Bateman, LPN,  June 19, 2010 3:57 PM  Additional Follow-up for Phone Call Additional follow up Details #1::        i spoke to pt. I stopped her nightimt Toprol and continued dilt at same dose. has f/u tomorrow. Dolores Patty, MD, [facility]  June 22, 2010 11:44 AM     New/Updated Medications: DILTIAZEM HCL ER BEADS 300 MG XR24H-CAP (DILTIAZEM HCL ER BEADS) 1 once daily Prescriptions: DILTIAZEM HCL ER BEADS 300 MG XR24H-CAP (DILTIAZEM HCL ER BEADS) 1 once daily  #30 x 6   Entered by:   Scherrie Bateman, LPN   Authorized by:   Rollene Rotunda, MD, [facility]   Signed by:   Scherrie Bateman, LPN on 45/40/9811   Method used:   Faxed to ...       [facility]       [address]       [place], [place]  [address]       Ph: [phone]       Fax: [phone]   RxID:   [id number]

## 2010-07-09 NOTE — Letter (Signed)
Summary: Generic Letter  [facility]ologist, Main Office  [address]   [place], [place] [address]   Phone: [phone]  Fax: [phone]    06/23/2010  To whom it may concern, 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           Sincerely,   Tereso Newcomer PA-C  Appended Document: Generic Letter DATE CHANGED TO 06/15/10 AT PT'S REQUEST. AND COPY FAXED TO UNEMPLOYMENT OFFICE./CY

## 2010-07-09 NOTE — Letter (Signed)
Summary: [facility] Patient Note   [facility] Patient Note   Imported By: Roderic Ovens 07/01/2010 13:58:44  _____________________________________________________________________  External Attachment:    Type:   Image     Comment:   External Document
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
## 2010-07-09 NOTE — Progress Notes (Signed)
Summary: [facility] re med  Phone Note From Pharmacy Call back at [phone]  option 2   Caller: [facility] Summary of Call: ref# [id number] [facility] needs a call before jan 27th. re METOPROLOL SUCCINATE. [facility] has dosage question. Initial call taken by: Roe Coombs,  July 01, 2010 10:54 AM  Follow-up for Phone Call        pt had requested a refill for met. succ 100mg  two times a day, they wanted to check dose as she just had 50mg  tabs filled, advised pt was on dose of 150mg  and was receiving both a 100mg  and a 50mg  tab, however at last of dose was reduced back to 100mg  two times a day, they will fill Cynthia Staggers, RN  July 01, 2010 2:01 PM     New/Updated Medications: METOPROLOL SUCCINATE 100 MG XR24H-TAB (METOPROLOL SUCCINATE) Take 1 tablet by mouth two times a day Prescriptions: METOPROLOL SUCCINATE 100 MG XR24H-TAB (METOPROLOL SUCCINATE) Take 1 tablet by mouth two times a day  #180 x 3   Entered by:   Cynthia Staggers, RN   Authorized by:   Dolores Patty, MD, [facility]   Signed by:   Cynthia Staggers, RN on 07/01/2010   Method used:   Telephoned to ...       [facility] (mail-order)             , [place]         Ph: [phone]       Fax: [phone]   RxID:   [id number]

## 2010-07-10 ENCOUNTER — Ambulatory Visit (INDEPENDENT_AMBULATORY_CARE_PROVIDER_SITE_OTHER): Payer: BC Managed Care – PPO | Admitting: Internal Medicine

## 2010-07-10 ENCOUNTER — Encounter: Payer: Self-pay | Admitting: Internal Medicine

## 2010-07-10 ENCOUNTER — Ambulatory Visit: Admit: 2010-07-10 | Payer: Self-pay | Admitting: Internal Medicine

## 2010-07-10 DIAGNOSIS — I5032 Chronic diastolic (congestive) heart failure: Secondary | ICD-10-CM

## 2010-07-10 DIAGNOSIS — Z01810 Encounter for preprocedural cardiovascular examination: Secondary | ICD-10-CM

## 2010-07-10 DIAGNOSIS — I4891 Unspecified atrial fibrillation: Secondary | ICD-10-CM

## 2010-07-10 DIAGNOSIS — I10 Essential (primary) hypertension: Secondary | ICD-10-CM
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
## 2010-07-21 ENCOUNTER — Telehealth (INDEPENDENT_AMBULATORY_CARE_PROVIDER_SITE_OTHER): Payer: Self-pay | Admitting: *Deleted

## 2010-07-22 ENCOUNTER — Encounter: Payer: Self-pay | Admitting: Cardiology

## 2010-07-22 ENCOUNTER — Ambulatory Visit (HOSPITAL_COMMUNITY): Payer: BC Managed Care – PPO | Attending: Internal Medicine

## 2010-07-22 ENCOUNTER — Encounter: Payer: Self-pay | Admitting: Internal Medicine

## 2010-07-22 DIAGNOSIS — R9431 Abnormal electrocardiogram [ECG] [EKG]: Secondary | ICD-10-CM

## 2010-07-22 DIAGNOSIS — I4891 Unspecified atrial fibrillation: Secondary | ICD-10-CM | POA: Insufficient documentation

## 2010-07-22 DIAGNOSIS — R0609 Other forms of dyspnea: Secondary | ICD-10-CM

## 2010-07-22 DIAGNOSIS — I4949 Other premature depolarization: Secondary | ICD-10-CM

## 2010-07-29 NOTE — Assessment & Plan Note (Addendum)
Summary: Cardiology Nuclear Testing  Nuclear Med Background Indications for Stress Test: Evaluation for Ischemia, Post Hospital  Indications Comments: 1/12 [facility] with low HR,weak,CHF and Afib with RVR  History: Ablation, Cardioversion, Echo, Myocardial Perfusion Study  History Comments: Multi cardioversions; Afib/flutter and diast.CHF 09 MPS no ischemia or scar;EF=nl 1/12 Echo EF-55%,severe LVH,MAC & mild MVS and MR  Symptoms: DOE, Fatigue, Palpitations    Nuclear Pre-Procedure Cardiac Risk Factors: Hypertension Caffeine/Decaff Intake: none NPO After: 8:00 PM Lungs: clear IV 0.9% NS with Angio Cath: 20g     IV Site: R Forearm Chest Size (in) 42     Cup Size DD     Height (in): 67 Weight (lb): 160 BMI: 25.15  Nuclear Med Study 1 or 2 day study:  1 day     Stress Test Type:  Lexiscan Reading MD:  Marca Ancona, MD     Referring MD:  Shirlee Limerick Resting Radionuclide:  Technetium 21m Tetrofosmin     Resting Radionuclide Dose:  11.0 mCi  Stress Radionuclide:  Technetium 60m Tetrofosmin     Stress Radionuclide Dose:  33.0 mCi   Stress Protocol  Max Systolic BP: 144 mm Hg Lexiscan: 0.4 mg   Stress Test Technologist:  Milana Na, EMT-P     Nuclear Technologist:  Doyne Keel, CNMT  Rest Procedure  Myocardial perfusion imaging was performed at rest 45 minutes following the intravenous administration of Technetium 82m Tetrofosmin.  Stress Procedure  The patient received IV Lexiscan 0.4 mg over 15-seconds.  Technetium 85m Tetrofosmin injected at 30-seconds.  There were no significant changes and rare pvcs with infusion.  Quantitative spect images were obtained after a 45 minute delay.  QPS Raw Data Images:  Normal; no motion artifact; normal heart/lung ratio. Stress Images:  Normal homogeneous uptake in all areas of the myocardium. Rest Images:  Normal homogeneous uptake in all areas of the myocardium. Subtraction (SDS):  There is no evidence of scar or  ischemia. Transient Ischemic Dilatation:  0.89  (Normal <1.22)  Lung/Heart Ratio:  0.34  (Normal <0.45)  Quantitative Gated Spect Images QGS EDV:  99 ml QGS ESV:  52 ml QGS EF:  48 % QGS cine images:  Mild global hypokinesis.    Overall Impression  Exercise Capacity: Lexiscan with no exercise. BP Response: Normal blood pressure response. Clinical Symptoms: Short of breath.  ECG Impression: Atrial fibrillation, no change with infusion.  Overall Impression: No ischemia or infarction Overall Impression Comments: EF 48%.  Mild nonischemic cardiomyopathy.   Appended Document: Cardiology Nuclear Testing Low-risk. let's get MUGA to re-assess EF. We will need to forward results to renal transplant clinic at [facility]  Appended Document: Cardiology Nuclear Testing Left message to call back   Appended Document: Cardiology Nuclear Testing pt aware see phone mess dated 2/23

## 2010-07-29 NOTE — Progress Notes (Signed)
Summary: nuc pre procedure  Phone Note Outgoing Call Call back at Home Phone [phone]   Call placed by: Cathlyn Parsons RN,  July 21, 2010 12:31 PM Call placed to: Patient Reason for Call: Confirm/change Appt Summary of Call: Reviewed information on Myoview Information Sheet (see scanned document for further details).  Spoke with patient.      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

## 2010-07-30 ENCOUNTER — Telehealth: Payer: Self-pay | Admitting: Internal Medicine

## 2010-07-31 ENCOUNTER — Ambulatory Visit (INDEPENDENT_AMBULATORY_CARE_PROVIDER_SITE_OTHER)
Admission: RE | Admit: 2010-07-31 | Discharge: 2010-07-31 | Disposition: A | Discharge status: Home / Self Care | Payer: BC Managed Care – PPO | Source: Ambulatory Visit | Attending: Internal Medicine | Admitting: Internal Medicine

## 2010-07-31 ENCOUNTER — Telehealth: Payer: Self-pay | Admitting: Internal Medicine

## 2010-07-31 ENCOUNTER — Encounter: Payer: Self-pay | Admitting: Internal Medicine

## 2010-07-31 ENCOUNTER — Other Ambulatory Visit: Payer: Self-pay | Admitting: Internal Medicine

## 2010-07-31 DIAGNOSIS — R05 Cough: Secondary | ICD-10-CM

## 2010-08-04 NOTE — Progress Notes (Signed)
Summary: test result  Phone Note Call from Patient Call back at Home Phone [phone]   Caller: Patient Reason for Call: Talk to Nurse, Lab or Test Results Initial call taken by: Lorne Skeens,  July 30, 2010 9:51 AM  Follow-up for Phone Call        pt given myoview results, order placed for MUGA.  Pt ask about changing clonidine to patch and also if she needs to get a chest xray she has had a cough for several weeks and is wheezy, will discuss w/Dr Lyndzie Zentz and call her back Meredith Staggers, RN  July 30, 2010 1:43 PM   per Dr Gala Romney ok for patch and chest xray, pt is aware she will go for xray tom. Meredith Staggers, RN  July 30, 2010 5:43 PM     New/Updated Medications: CLONIDINE HCL 0.2 MG/24HR PTWK (CLONIDINE HCL) Apply 1 patch daily Prescriptions: CLONIDINE HCL 0.2 MG/24HR PTWK (CLONIDINE HCL) Apply 1 patch daily  #30 x 6   Entered by:   Meredith Staggers, RN   Authorized by:   Dolores Patty, MD, [facility]   Signed by:   Meredith Staggers, RN on 07/30/2010   Method used:   Electronically to        [facility] (retail)       [address]       [place], [place]  [address]       Ph: [phone]       Fax: [phone]   RxID:   [id number]

## 2010-08-07 ENCOUNTER — Telehealth: Payer: Self-pay | Admitting: Internal Medicine

## 2010-08-13 NOTE — Progress Notes (Signed)
Summary: pt request call only want to talk to [facility]  Phone Note Call from Patient Call back at Home Phone [phone] [facility]     Caller: Patient Summary of Call: Pt request call only want to talk to [facility] no other nurse Initial call taken by: Judie Grieve,  August 07, 2010 9:49 AM  Follow-up for Phone Call        Left message to call back Meredith Staggers, RN  August 07, 2010 11:11 AM   spoke w/pt earlier she states her cough has cont. on today is her last dose of Avelox and not sure if she needs something else, discussed it w/Dr Bensimhon he wants pt to wait and have a chest xray in 1 week, and may need pulm referral in future, pt is aware and will go to [facility] next Fri for xray Meredith Staggers, RN  August 07, 2010 1:50 PM

## 2010-08-13 NOTE — Progress Notes (Signed)
Summary: x-ray results  Phone Note Call from Patient Call back at Home Phone [phone] [facility]     Caller: Patient Summary of Call: Chest x-ray results Initial call taken by: Judie Grieve,  July 31, 2010 3:51 PM  Follow-up for Phone Call        Xray has not been read yet. Patient aware. Whitney Maeola Sarah RN  July 31, 2010 3:57 PM  Follow-up by: Whitney Maeola Sarah RN,  July 31, 2010 3:57 PM  Additional Follow-up for Phone Call Additional follow up Details #1::        Dr Gala Romney spoke w/pt over weekend and started Avelox Meredith Staggers, RN  August 03, 2010 9:39 AM

## 2010-08-13 NOTE — Letter (Signed)
Summary: [facility] - [facility] Note  [facility] Note   Imported By: Marylou Mccoy 08/06/2010 16:05:59  _____________________________________________________________________  External Attachment:    Type:   Image     Comment:   External Document

## 2010-08-14 ENCOUNTER — Encounter: Payer: Self-pay | Admitting: Internal Medicine

## 2010-08-14 ENCOUNTER — Other Ambulatory Visit: Payer: Self-pay | Admitting: Internal Medicine

## 2010-08-14 ENCOUNTER — Ambulatory Visit (INDEPENDENT_AMBULATORY_CARE_PROVIDER_SITE_OTHER)
Admission: RE | Admit: 2010-08-14 | Discharge: 2010-08-14 | Disposition: A | Discharge status: Home / Self Care | Payer: BC Managed Care – PPO | Source: Ambulatory Visit | Attending: Internal Medicine | Admitting: Internal Medicine

## 2010-08-14 DIAGNOSIS — R05 Cough: Secondary | ICD-10-CM

## 2010-08-17 ENCOUNTER — Telehealth: Payer: Self-pay | Admitting: Internal Medicine

## 2010-08-17 LAB — URINALYSIS, ROUTINE W REFLEX MICROSCOPIC
Glucose, UA: NEGATIVE mg/dL
Specific Gravity, Urine: 1.018 (ref 1.005–1.030)
pH: 5.5 (ref 5.0–8.0)

## 2010-08-17 LAB — COMPREHENSIVE METABOLIC PANEL
BUN: 59 mg/dL — ABNORMAL HIGH (ref 6–23)
CO2: 21 mEq/L (ref 19–32)
Calcium: 9.8 mg/dL (ref 8.4–10.5)
Creatinine, Ser: 2.83 mg/dL — ABNORMAL HIGH (ref 0.4–1.2)
GFR calc non Af Amer: 18 mL/min — ABNORMAL LOW (ref >60)
Glucose, Bld: 99 mg/dL (ref 70–99)

## 2010-08-17 LAB — CBC
HCT: 27.2 % — ABNORMAL LOW (ref 36.0–46.0)
Hemoglobin: 8.6 g/dL — ABNORMAL LOW (ref 12.0–15.0)
MCH: 29.6 pg (ref 26.0–34.0)
MCHC: 31.6 g/dL (ref 30.0–36.0)
MCV: 95 fL (ref 78.0–100.0)
Platelets: 281 10*3/uL (ref 150–400)
RDW: 14 % (ref 11.5–15.5)
WBC: 23.6 10*3/uL — ABNORMAL HIGH (ref 4.0–10.5)

## 2010-08-17 LAB — IRON AND TIBC
Iron: 16 ug/dL — ABNORMAL LOW (ref 42–135)
Saturation Ratios: 9 % — ABNORMAL LOW (ref 20–55)
TIBC: 169 ug/dL — ABNORMAL LOW (ref 250–470)
UIBC: 153 ug/dL

## 2010-08-17 LAB — DIFFERENTIAL
Basophils Absolute: 0 10*3/uL (ref 0.0–0.1)
Eosinophils Relative: 0 % (ref 0–5)
Lymphocytes Relative: 2 % — ABNORMAL LOW (ref 12–46)
Neutrophils Relative %: 93 % — ABNORMAL HIGH (ref 43–77)

## 2010-08-17 LAB — URINE MICROSCOPIC-ADD ON

## 2010-08-17 LAB — CK TOTAL AND CKMB (NOT AT ARMC)
CK, MB: 2.3 ng/mL (ref 0.3–4.0)
Relative Index: INVALID (ref 0.0–2.5)
Relative Index: INVALID (ref 0.0–2.5)
Total CK: 40 U/L (ref 7–177)

## 2010-08-17 LAB — FERRITIN: Ferritin: 680 ng/mL — ABNORMAL HIGH (ref 10–291)

## 2010-08-17 LAB — BRAIN NATRIURETIC PEPTIDE
Pro B Natriuretic peptide (BNP): 1733 pg/mL — ABNORMAL HIGH (ref 0.0–100.0)
Pro B Natriuretic peptide (BNP): 1753 pg/mL — ABNORMAL HIGH (ref 0.0–100.0)

## 2010-08-17 LAB — QUANTIFERON TB GOLD ASSAY (BLOOD)
Mitogen Minus Nil Value: 0.22 IU/mL
Quantiferon Nil Value: 0.07 IU/mL
TB Antigen Minus Nil Value: <0 IU/mL

## 2010-08-17 LAB — PHOSPHORUS: Phosphorus: 3.7 mg/dL (ref 2.3–4.6)

## 2010-08-17 LAB — MAGNESIUM: Magnesium: 2.2 mg/dL (ref 1.5–2.5)

## 2010-08-17 LAB — BASIC METABOLIC PANEL
BUN: 57 mg/dL — ABNORMAL HIGH (ref 6–23)
Calcium: 9.5 mg/dL (ref 8.4–10.5)
Chloride: 105 mEq/L (ref 96–112)
Creatinine, Ser: 2.72 mg/dL — ABNORMAL HIGH (ref 0.4–1.2)
GFR calc Af Amer: 23 mL/min — ABNORMAL LOW (ref >60)
GFR calc non Af Amer: 19 mL/min — ABNORMAL LOW (ref >60)

## 2010-08-17 LAB — TROPONIN I
Troponin I: 0.08 ng/mL — ABNORMAL HIGH (ref 0.00–0.06)
Troponin I: 0.09 ng/mL — ABNORMAL HIGH (ref 0.00–0.06)

## 2010-08-17 LAB — CARDIAC PANEL(CRET KIN+CKTOT+MB+TROPI)
Relative Index: INVALID (ref 0.0–2.5)
Troponin I: 0.09 ng/mL — ABNORMAL HIGH (ref 0.00–0.06)

## 2010-08-17 LAB — AFB CULTURE, BLOOD

## 2010-08-18 ENCOUNTER — Other Ambulatory Visit: Payer: Self-pay | Admitting: Internal Medicine

## 2010-08-18 DIAGNOSIS — R9389 Abnormal findings on diagnostic imaging of other specified body structures: Secondary | ICD-10-CM

## 2010-08-21 ENCOUNTER — Ambulatory Visit (INDEPENDENT_AMBULATORY_CARE_PROVIDER_SITE_OTHER)
Admission: RE | Admit: 2010-08-21 | Discharge: 2010-08-21 | Disposition: A | Discharge status: Home / Self Care | Payer: BC Managed Care – PPO | Source: Ambulatory Visit | Attending: Internal Medicine | Admitting: Internal Medicine

## 2010-08-21 DIAGNOSIS — R918 Other nonspecific abnormal finding of lung field: Secondary | ICD-10-CM

## 2010-08-21 DIAGNOSIS — R9389 Abnormal findings on diagnostic imaging of other specified body structures: Secondary | ICD-10-CM

## 2010-08-23 LAB — CBC
HCT: 24.9 % — ABNORMAL LOW (ref 36.0–46.0)
HCT: 24.9 % — ABNORMAL LOW (ref 36.0–46.0)
HCT: 25.3 % — ABNORMAL LOW (ref 36.0–46.0)
HCT: 25.6 % — ABNORMAL LOW (ref 36.0–46.0)
HCT: 25.9 % — ABNORMAL LOW (ref 36.0–46.0)
HCT: 26.1 % — ABNORMAL LOW (ref 36.0–46.0)
HCT: 26.2 % — ABNORMAL LOW (ref 36.0–46.0)
HCT: 26.3 % — ABNORMAL LOW (ref 36.0–46.0)
HCT: 26.5 % — ABNORMAL LOW (ref 36.0–46.0)
HCT: 27.2 % — ABNORMAL LOW (ref 36.0–46.0)
HCT: 27.8 % — ABNORMAL LOW (ref 36.0–46.0)
HCT: 28 % — ABNORMAL LOW (ref 36.0–46.0)
HCT: 28.7 % — ABNORMAL LOW (ref 36.0–46.0)
HCT: 33.4 % — ABNORMAL LOW (ref 36.0–46.0)
HCT: 21.9 % — ABNORMAL LOW (ref 36.0–46.0)
HCT: 22.6 % — ABNORMAL LOW (ref 36.0–46.0)
HCT: 22.6 % — ABNORMAL LOW (ref 36.0–46.0)
HCT: 22.9 % — ABNORMAL LOW (ref 36.0–46.0)
HCT: 26.3 % — ABNORMAL LOW (ref 36.0–46.0)
HCT: 26.6 % — ABNORMAL LOW (ref 36.0–46.0)
HCT: 26.7 % — ABNORMAL LOW (ref 36.0–46.0)
HCT: 28.2 % — ABNORMAL LOW (ref 36.0–46.0)
HCT: 29.3 % — ABNORMAL LOW (ref 36.0–46.0)
Hemoglobin: 10 g/dL — ABNORMAL LOW (ref 12.0–15.0)
Hemoglobin: 11.2 g/dL — ABNORMAL LOW (ref 12.0–15.0)
Hemoglobin: 8.4 g/dL — ABNORMAL LOW (ref 12.0–15.0)
Hemoglobin: 8.4 g/dL — ABNORMAL LOW (ref 12.0–15.0)
Hemoglobin: 8.5 g/dL — ABNORMAL LOW (ref 12.0–15.0)
Hemoglobin: 8.7 g/dL — ABNORMAL LOW (ref 12.0–15.0)
Hemoglobin: 8.7 g/dL — ABNORMAL LOW (ref 12.0–15.0)
Hemoglobin: 8.8 g/dL — ABNORMAL LOW (ref 12.0–15.0)
Hemoglobin: 8.9 g/dL — ABNORMAL LOW (ref 12.0–15.0)
Hemoglobin: 9.2 g/dL — ABNORMAL LOW (ref 12.0–15.0)
Hemoglobin: 9.3 g/dL — ABNORMAL LOW (ref 12.0–15.0)
Hemoglobin: 9.4 g/dL — ABNORMAL LOW (ref 12.0–15.0)
Hemoglobin: 10.1 g/dL — ABNORMAL LOW (ref 12.0–15.0)
Hemoglobin: 7.1 g/dL — ABNORMAL LOW (ref 12.0–15.0)
Hemoglobin: 8.2 g/dL — ABNORMAL LOW (ref 12.0–15.0)
Hemoglobin: 8.8 g/dL — ABNORMAL LOW (ref 12.0–15.0)
Hemoglobin: 9 g/dL — ABNORMAL LOW (ref 12.0–15.0)
Hemoglobin: 9.6 g/dL — ABNORMAL LOW (ref 12.0–15.0)
Hemoglobin: 9.7 g/dL — ABNORMAL LOW (ref 12.0–15.0)
MCHC: 32.8 g/dL (ref 30.0–36.0)
MCHC: 33.1 g/dL (ref 30.0–36.0)
MCHC: 33.1 g/dL (ref 30.0–36.0)
MCHC: 33.1 g/dL (ref 30.0–36.0)
MCHC: 33.4 g/dL (ref 30.0–36.0)
MCHC: 33.5 g/dL (ref 30.0–36.0)
MCHC: 33.6 g/dL (ref 30.0–36.0)
MCHC: 33.7 g/dL (ref 30.0–36.0)
MCHC: 33.7 g/dL (ref 30.0–36.0)
MCHC: 33.8 g/dL (ref 30.0–36.0)
MCHC: 34.2 g/dL (ref 30.0–36.0)
MCHC: 34.3 g/dL (ref 30.0–36.0)
MCHC: 31.4 g/dL (ref 30.0–36.0)
MCHC: 32.9 g/dL (ref 30.0–36.0)
MCHC: 33 g/dL (ref 30.0–36.0)
MCHC: 33.3 g/dL (ref 30.0–36.0)
MCHC: 33.4 g/dL (ref 30.0–36.0)
MCHC: 33.5 g/dL (ref 30.0–36.0)
MCHC: 33.6 g/dL (ref 30.0–36.0)
MCHC: 33.8 g/dL (ref 30.0–36.0)
MCHC: 33.9 g/dL (ref 30.0–36.0)
MCV: 93.1 fL (ref 78.0–100.0)
MCV: 93.3 fL (ref 78.0–100.0)
MCV: 93.4 fL (ref 78.0–100.0)
MCV: 93.9 fL (ref 78.0–100.0)
MCV: 93.9 fL (ref 78.0–100.0)
MCV: 93.9 fL (ref 78.0–100.0)
MCV: 94.1 fL (ref 78.0–100.0)
MCV: 94.2 fL (ref 78.0–100.0)
MCV: 94.2 fL (ref 78.0–100.0)
MCV: 94.4 fL (ref 78.0–100.0)
MCV: 94.5 fL (ref 78.0–100.0)
MCV: 95.2 fL (ref 78.0–100.0)
MCV: 95.9 fL (ref 78.0–100.0)
MCV: 90.8 fL (ref 78.0–100.0)
MCV: 91.1 fL (ref 78.0–100.0)
MCV: 91.4 fL (ref 78.0–100.0)
MCV: 91.4 fL (ref 78.0–100.0)
MCV: 91.4 fL (ref 78.0–100.0)
MCV: 91.8 fL (ref 78.0–100.0)
MCV: 92.2 fL (ref 78.0–100.0)
MCV: 92.8 fL (ref 78.0–100.0)
MCV: 93.1 fL (ref 78.0–100.0)
MCV: 93.2 fL (ref 78.0–100.0)
MCV: 93.6 fL (ref 78.0–100.0)
MCV: 94.1 fL (ref 78.0–100.0)
Platelets: 126 10*3/uL — ABNORMAL LOW (ref 150–400)
Platelets: 129 10*3/uL — ABNORMAL LOW (ref 150–400)
Platelets: 132 10*3/uL — ABNORMAL LOW (ref 150–400)
Platelets: 142 10*3/uL — ABNORMAL LOW (ref 150–400)
Platelets: 142 10*3/uL — ABNORMAL LOW (ref 150–400)
Platelets: 149 10*3/uL — ABNORMAL LOW (ref 150–400)
Platelets: 161 10*3/uL (ref 150–400)
Platelets: 181 10*3/uL (ref 150–400)
Platelets: 189 10*3/uL (ref 150–400)
Platelets: 193 10*3/uL (ref 150–400)
Platelets: 193 10*3/uL (ref 150–400)
Platelets: 196 10*3/uL (ref 150–400)
Platelets: 196 10*3/uL (ref 150–400)
Platelets: 201 10*3/uL (ref 150–400)
Platelets: 202 10*3/uL (ref 150–400)
Platelets: 256 10*3/uL (ref 150–400)
Platelets: 303 K/uL (ref 150–400)
Platelets: 165 10*3/uL (ref 150–400)
Platelets: 165 10*3/uL (ref 150–400)
Platelets: 166 10*3/uL (ref 150–400)
Platelets: 168 10*3/uL (ref 150–400)
Platelets: 169 10*3/uL (ref 150–400)
Platelets: 182 10*3/uL (ref 150–400)
Platelets: 192 10*3/uL (ref 150–400)
Platelets: 198 10*3/uL (ref 150–400)
Platelets: 216 10*3/uL (ref 150–400)
Platelets: 228 10*3/uL (ref 150–400)
Platelets: 251 10*3/uL (ref 150–400)
Platelets: 266 10*3/uL (ref 150–400)
Platelets: 267 10*3/uL (ref 150–400)
Platelets: 277 10*3/uL (ref 150–400)
Platelets: 287 10*3/uL (ref 150–400)
RBC: 2.65 MIL/uL — ABNORMAL LOW (ref 3.87–5.11)
RBC: 2.74 MIL/uL — ABNORMAL LOW (ref 3.87–5.11)
RBC: 2.78 MIL/uL — ABNORMAL LOW (ref 3.87–5.11)
RBC: 2.94 MIL/uL — ABNORMAL LOW (ref 3.87–5.11)
RBC: 2.96 MIL/uL — ABNORMAL LOW (ref 3.87–5.11)
RBC: 3.17 MIL/uL — ABNORMAL LOW (ref 3.87–5.11)
RBC: 3.48 MIL/uL — ABNORMAL LOW (ref 3.87–5.11)
RBC: 2.36 MIL/uL — ABNORMAL LOW (ref 3.87–5.11)
RBC: 2.47 MIL/uL — ABNORMAL LOW (ref 3.87–5.11)
RBC: 2.61 MIL/uL — ABNORMAL LOW (ref 3.87–5.11)
RBC: 2.62 MIL/uL — ABNORMAL LOW (ref 3.87–5.11)
RBC: 2.72 MIL/uL — ABNORMAL LOW (ref 3.87–5.11)
RBC: 2.83 MIL/uL — ABNORMAL LOW (ref 3.87–5.11)
RBC: 2.88 MIL/uL — ABNORMAL LOW (ref 3.87–5.11)
RBC: 2.9 MIL/uL — ABNORMAL LOW (ref 3.87–5.11)
RBC: 2.91 MIL/uL — ABNORMAL LOW (ref 3.87–5.11)
RBC: 3.06 MIL/uL — ABNORMAL LOW (ref 3.87–5.11)
RBC: 3.16 MIL/uL — ABNORMAL LOW (ref 3.87–5.11)
RBC: 3.23 MIL/uL — ABNORMAL LOW (ref 3.87–5.11)
RBC: 3.27 MIL/uL — ABNORMAL LOW (ref 3.87–5.11)
RDW: 16.3 % — ABNORMAL HIGH (ref 11.5–15.5)
RDW: 16.3 % — ABNORMAL HIGH (ref 11.5–15.5)
RDW: 16.4 % — ABNORMAL HIGH (ref 11.5–15.5)
RDW: 16.5 % — ABNORMAL HIGH (ref 11.5–15.5)
RDW: 16.5 % — ABNORMAL HIGH (ref 11.5–15.5)
RDW: 16.6 % — ABNORMAL HIGH (ref 11.5–15.5)
RDW: 16.7 % — ABNORMAL HIGH (ref 11.5–15.5)
RDW: 17 % — ABNORMAL HIGH (ref 11.5–15.5)
RDW: 17 % — ABNORMAL HIGH (ref 11.5–15.5)
RDW: 17.1 % — ABNORMAL HIGH (ref 11.5–15.5)
RDW: 17.2 % — ABNORMAL HIGH (ref 11.5–15.5)
RDW: 17.2 % — ABNORMAL HIGH (ref 11.5–15.5)
RDW: 17.2 % — ABNORMAL HIGH (ref 11.5–15.5)
RDW: 17.3 % — ABNORMAL HIGH (ref 11.5–15.5)
RDW: 17.6 % — ABNORMAL HIGH (ref 11.5–15.5)
RDW: 17.7 % — ABNORMAL HIGH (ref 11.5–15.5)
RDW: 17.9 % — ABNORMAL HIGH (ref 11.5–15.5)
RDW: 18.6 % — ABNORMAL HIGH (ref 11.5–15.5)
RDW: 18.7 % — ABNORMAL HIGH (ref 11.5–15.5)
RDW: 16.1 % — ABNORMAL HIGH (ref 11.5–15.5)
RDW: 16.2 % — ABNORMAL HIGH (ref 11.5–15.5)
RDW: 16.3 % — ABNORMAL HIGH (ref 11.5–15.5)
RDW: 16.3 % — ABNORMAL HIGH (ref 11.5–15.5)
RDW: 16.3 % — ABNORMAL HIGH (ref 11.5–15.5)
RDW: 16.5 % — ABNORMAL HIGH (ref 11.5–15.5)
RDW: 16.5 % — ABNORMAL HIGH (ref 11.5–15.5)
RDW: 16.5 % — ABNORMAL HIGH (ref 11.5–15.5)
RDW: 16.6 % — ABNORMAL HIGH (ref 11.5–15.5)
RDW: 16.6 % — ABNORMAL HIGH (ref 11.5–15.5)
WBC: 10 10*3/uL (ref 4.0–10.5)
WBC: 10.1 10*3/uL (ref 4.0–10.5)
WBC: 11.1 10*3/uL — ABNORMAL HIGH (ref 4.0–10.5)
WBC: 11.4 10*3/uL — ABNORMAL HIGH (ref 4.0–10.5)
WBC: 12.1 10*3/uL — ABNORMAL HIGH (ref 4.0–10.5)
WBC: 15.4 10*3/uL — ABNORMAL HIGH (ref 4.0–10.5)
WBC: 15.6 10*3/uL — ABNORMAL HIGH (ref 4.0–10.5)
WBC: 15.7 10*3/uL — ABNORMAL HIGH (ref 4.0–10.5)
WBC: 16 10*3/uL — ABNORMAL HIGH (ref 4.0–10.5)
WBC: 18.3 10*3/uL — ABNORMAL HIGH (ref 4.0–10.5)
WBC: 19.3 10*3/uL — ABNORMAL HIGH (ref 4.0–10.5)
WBC: 21.5 10*3/uL — ABNORMAL HIGH (ref 4.0–10.5)
WBC: 8.6 10*3/uL (ref 4.0–10.5)
WBC: 8.9 10*3/uL (ref 4.0–10.5)
WBC: 8.9 10*3/uL (ref 4.0–10.5)
WBC: 10.1 10*3/uL (ref 4.0–10.5)
WBC: 10.3 10*3/uL (ref 4.0–10.5)
WBC: 11.1 10*3/uL — ABNORMAL HIGH (ref 4.0–10.5)
WBC: 11.8 10*3/uL — ABNORMAL HIGH (ref 4.0–10.5)
WBC: 14.4 10*3/uL — ABNORMAL HIGH (ref 4.0–10.5)
WBC: 6.4 10*3/uL (ref 4.0–10.5)
WBC: 6.8 10*3/uL (ref 4.0–10.5)
WBC: 6.8 10*3/uL (ref 4.0–10.5)
WBC: 7.7 10*3/uL (ref 4.0–10.5)
WBC: 8.4 10*3/uL (ref 4.0–10.5)
WBC: 8.6 10*3/uL (ref 4.0–10.5)
WBC: 8.6 10*3/uL (ref 4.0–10.5)
WBC: 9.1 10*3/uL (ref 4.0–10.5)
WBC: 9.3 10*3/uL (ref 4.0–10.5)
WBC: 9.5 10*3/uL (ref 4.0–10.5)

## 2010-08-23 LAB — RENAL FUNCTION PANEL
Albumin: 2.4 g/dL — ABNORMAL LOW (ref 3.5–5.2)
Albumin: 2.6 g/dL — ABNORMAL LOW (ref 3.5–5.2)
Albumin: 2.6 g/dL — ABNORMAL LOW (ref 3.5–5.2)
Albumin: 2.7 g/dL — ABNORMAL LOW (ref 3.5–5.2)
Albumin: 2.1 g/dL — ABNORMAL LOW (ref 3.5–5.2)
Albumin: 2.3 g/dL — ABNORMAL LOW (ref 3.5–5.2)
Albumin: 2.3 g/dL — ABNORMAL LOW (ref 3.5–5.2)
Albumin: 2.9 g/dL — ABNORMAL LOW (ref 3.5–5.2)
BUN: 33 mg/dL — ABNORMAL HIGH (ref 6–23)
BUN: 34 mg/dL — ABNORMAL HIGH (ref 6–23)
BUN: 34 mg/dL — ABNORMAL HIGH (ref 6–23)
BUN: 41 mg/dL — ABNORMAL HIGH (ref 6–23)
BUN: 46 mg/dL — ABNORMAL HIGH (ref 6–23)
BUN: 26 mg/dL — ABNORMAL HIGH (ref 6–23)
BUN: 27 mg/dL — ABNORMAL HIGH (ref 6–23)
BUN: 29 mg/dL — ABNORMAL HIGH (ref 6–23)
BUN: 31 mg/dL — ABNORMAL HIGH (ref 6–23)
BUN: 33 mg/dL — ABNORMAL HIGH (ref 6–23)
CO2: 22 mEq/L (ref 19–32)
CO2: 23 mEq/L (ref 19–32)
CO2: 24 mEq/L (ref 19–32)
CO2: 25 mEq/L (ref 19–32)
CO2: 26 mEq/L (ref 19–32)
CO2: 19 mEq/L (ref 19–32)
CO2: 20 mEq/L (ref 19–32)
CO2: 21 mEq/L (ref 19–32)
CO2: 24 mEq/L (ref 19–32)
Calcium: 10 mg/dL (ref 8.4–10.5)
Calcium: 8.1 mg/dL — ABNORMAL LOW (ref 8.4–10.5)
Calcium: 9.2 mg/dL (ref 8.4–10.5)
Calcium: 9.4 mg/dL (ref 8.4–10.5)
Calcium: 9.6 mg/dL (ref 8.4–10.5)
Calcium: 9.6 mg/dL (ref 8.4–10.5)
Calcium: 9.7 mg/dL (ref 8.4–10.5)
Calcium: 9.7 mg/dL (ref 8.4–10.5)
Calcium: 9.7 mg/dL (ref 8.4–10.5)
Calcium: 8.6 mg/dL (ref 8.4–10.5)
Calcium: 8.7 mg/dL (ref 8.4–10.5)
Calcium: 9.3 mg/dL (ref 8.4–10.5)
Chloride: 101 mEq/L (ref 96–112)
Chloride: 102 mEq/L (ref 96–112)
Chloride: 103 mEq/L (ref 96–112)
Chloride: 101 mEq/L (ref 96–112)
Chloride: 105 mEq/L (ref 96–112)
Chloride: 107 mEq/L (ref 96–112)
Creatinine, Ser: 2.44 mg/dL — ABNORMAL HIGH (ref 0.4–1.2)
Creatinine, Ser: 2.62 mg/dL — ABNORMAL HIGH (ref 0.4–1.2)
Creatinine, Ser: 2.8 mg/dL — ABNORMAL HIGH (ref 0.4–1.2)
Creatinine, Ser: 3.13 mg/dL — ABNORMAL HIGH (ref 0.4–1.2)
Creatinine, Ser: 3.57 mg/dL — ABNORMAL HIGH (ref 0.4–1.2)
Creatinine, Ser: 2.3 mg/dL — ABNORMAL HIGH (ref 0.4–1.2)
Creatinine, Ser: 2.49 mg/dL — ABNORMAL HIGH (ref 0.4–1.2)
Creatinine, Ser: 2.5 mg/dL — ABNORMAL HIGH (ref 0.4–1.2)
GFR calc Af Amer: 16 mL/min — ABNORMAL LOW (ref >60)
GFR calc Af Amer: 19 mL/min — ABNORMAL LOW (ref >60)
GFR calc Af Amer: 22 mL/min — ABNORMAL LOW (ref >60)
GFR calc Af Amer: 24 mL/min — ABNORMAL LOW (ref >60)
GFR calc Af Amer: 24 mL/min — ABNORMAL LOW (ref >60)
GFR calc Af Amer: 26 mL/min — ABNORMAL LOW (ref >60)
GFR calc Af Amer: 20 mL/min — ABNORMAL LOW (ref >60)
GFR calc Af Amer: 23 mL/min — ABNORMAL LOW (ref >60)
GFR calc Af Amer: 25 mL/min — ABNORMAL LOW (ref >60)
GFR calc Af Amer: 25 mL/min — ABNORMAL LOW (ref >60)
GFR calc Af Amer: 28 mL/min — ABNORMAL LOW (ref >60)
GFR calc non Af Amer: 13 mL/min — ABNORMAL LOW (ref >60)
GFR calc non Af Amer: 16 mL/min — ABNORMAL LOW (ref >60)
GFR calc non Af Amer: 18 mL/min — ABNORMAL LOW (ref >60)
GFR calc non Af Amer: 20 mL/min — ABNORMAL LOW (ref >60)
GFR calc non Af Amer: 20 mL/min — ABNORMAL LOW (ref >60)
GFR calc non Af Amer: 21 mL/min — ABNORMAL LOW (ref >60)
GFR calc non Af Amer: 19 mL/min — ABNORMAL LOW (ref >60)
GFR calc non Af Amer: 21 mL/min — ABNORMAL LOW (ref >60)
GFR calc non Af Amer: 21 mL/min — ABNORMAL LOW (ref >60)
GFR calc non Af Amer: 23 mL/min — ABNORMAL LOW (ref >60)
GFR calc non Af Amer: 24 mL/min — ABNORMAL LOW (ref >60)
Glucose, Bld: 129 mg/dL — ABNORMAL HIGH (ref 70–99)
Glucose, Bld: 99 mg/dL (ref 70–99)
Glucose, Bld: 85 mg/dL (ref 70–99)
Glucose, Bld: 97 mg/dL (ref 70–99)
Phosphorus: 2.4 mg/dL (ref 2.3–4.6)
Phosphorus: 2.8 mg/dL (ref 2.3–4.6)
Phosphorus: 3.9 mg/dL (ref 2.3–4.6)
Phosphorus: 4.6 mg/dL (ref 2.3–4.6)
Phosphorus: 7 mg/dL — ABNORMAL HIGH (ref 2.3–4.6)
Phosphorus: 7.3 mg/dL — ABNORMAL HIGH (ref 2.3–4.6)
Phosphorus: 2.8 mg/dL (ref 2.3–4.6)
Phosphorus: 2.9 mg/dL (ref 2.3–4.6)
Phosphorus: 2.9 mg/dL (ref 2.3–4.6)
Phosphorus: 3.4 mg/dL (ref 2.3–4.6)
Phosphorus: 4 mg/dL (ref 2.3–4.6)
Potassium: 3.2 mEq/L — ABNORMAL LOW (ref 3.5–5.1)
Potassium: 3.5 mEq/L (ref 3.5–5.1)
Potassium: 3.1 mEq/L — ABNORMAL LOW (ref 3.5–5.1)
Potassium: 3.3 mEq/L — ABNORMAL LOW (ref 3.5–5.1)
Potassium: 3.5 mEq/L (ref 3.5–5.1)
Potassium: 3.7 mEq/L (ref 3.5–5.1)
Potassium: 4 mEq/L (ref 3.5–5.1)
Sodium: 129 mEq/L — ABNORMAL LOW (ref 135–145)
Sodium: 129 mEq/L — ABNORMAL LOW (ref 135–145)
Sodium: 133 mEq/L — ABNORMAL LOW (ref 135–145)
Sodium: 136 mEq/L (ref 135–145)
Sodium: 136 mEq/L (ref 135–145)
Sodium: 136 mEq/L (ref 135–145)
Sodium: 131 mEq/L — ABNORMAL LOW (ref 135–145)
Sodium: 131 mEq/L — ABNORMAL LOW (ref 135–145)
Sodium: 131 mEq/L — ABNORMAL LOW (ref 135–145)
Sodium: 133 mEq/L — ABNORMAL LOW (ref 135–145)
Sodium: 133 mEq/L — ABNORMAL LOW (ref 135–145)

## 2010-08-23 LAB — URINALYSIS, ROUTINE W REFLEX MICROSCOPIC
Glucose, UA: NEGATIVE mg/dL
Protein, ur: 30 mg/dL — AB
Specific Gravity, Urine: 1.012 (ref 1.005–1.030)
Specific Gravity, Urine: 1.015 (ref 1.005–1.030)
Urobilinogen, UA: 0.2 mg/dL (ref 0.0–1.0)

## 2010-08-23 LAB — BASIC METABOLIC PANEL
BUN: 27 mg/dL — ABNORMAL HIGH (ref 6–23)
BUN: 32 mg/dL — ABNORMAL HIGH (ref 6–23)
CO2: 21 mEq/L (ref 19–32)
CO2: 18 mEq/L — ABNORMAL LOW (ref 19–32)
CO2: 23 mEq/L (ref 19–32)
Calcium: 9.5 mg/dL (ref 8.4–10.5)
Chloride: 99 mEq/L (ref 96–112)
Chloride: 108 mEq/L (ref 96–112)
Creatinine, Ser: 2.57 mg/dL — ABNORMAL HIGH (ref 0.4–1.2)
GFR calc Af Amer: 20 mL/min — ABNORMAL LOW (ref >60)
GFR calc Af Amer: 26 mL/min — ABNORMAL LOW (ref >60)
GFR calc non Af Amer: 20 mL/min — ABNORMAL LOW (ref >60)
GFR calc non Af Amer: 21 mL/min — ABNORMAL LOW (ref >60)
Glucose, Bld: 107 mg/dL — ABNORMAL HIGH (ref 70–99)
Glucose, Bld: 82 mg/dL (ref 70–99)
Glucose, Bld: 89 mg/dL (ref 70–99)
Potassium: 4.6 mEq/L (ref 3.5–5.1)
Potassium: 3.2 mEq/L — ABNORMAL LOW (ref 3.5–5.1)
Potassium: 4.1 mEq/L (ref 3.5–5.1)
Sodium: 132 mEq/L — ABNORMAL LOW (ref 135–145)
Sodium: 138 mEq/L (ref 135–145)

## 2010-08-23 LAB — PROTIME-INR
INR: 1.07 (ref 0.00–1.49)
INR: 1.11 (ref 0.00–1.49)
INR: 1.13 (ref 0.00–1.49)
INR: 1.15 (ref 0.00–1.49)
INR: 1.65 — ABNORMAL HIGH (ref 0.00–1.49)
INR: 2.62 — ABNORMAL HIGH (ref 0.00–1.49)
INR: 3.38 — ABNORMAL HIGH (ref 0.00–1.49)
INR: 1.11 (ref 0.00–1.49)
Prothrombin Time: 13.8 seconds (ref 11.6–15.2)
Prothrombin Time: 14.4 seconds (ref 11.6–15.2)
Prothrombin Time: 14.6 seconds (ref 11.6–15.2)
Prothrombin Time: 15.6 seconds — ABNORMAL HIGH (ref 11.6–15.2)
Prothrombin Time: 16.8 seconds — ABNORMAL HIGH (ref 11.6–15.2)
Prothrombin Time: 19.4 seconds — ABNORMAL HIGH (ref 11.6–15.2)
Prothrombin Time: 31.9 seconds — ABNORMAL HIGH (ref 11.6–15.2)
Prothrombin Time: 33.9 s — ABNORMAL HIGH (ref 11.6–15.2)
Prothrombin Time: 14.2 seconds (ref 11.6–15.2)

## 2010-08-23 LAB — CROSSMATCH
ABO/RH(D): O POS
ABO/RH(D): O POS
Antibody Screen: NEGATIVE

## 2010-08-23 LAB — GLUCOSE, CAPILLARY
Glucose-Capillary: 103 mg/dL — ABNORMAL HIGH (ref 70–99)
Glucose-Capillary: 103 mg/dL — ABNORMAL HIGH (ref 70–99)
Glucose-Capillary: 104 mg/dL — ABNORMAL HIGH (ref 70–99)
Glucose-Capillary: 104 mg/dL — ABNORMAL HIGH (ref 70–99)
Glucose-Capillary: 105 mg/dL — ABNORMAL HIGH (ref 70–99)
Glucose-Capillary: 106 mg/dL — ABNORMAL HIGH (ref 70–99)
Glucose-Capillary: 108 mg/dL — ABNORMAL HIGH (ref 70–99)
Glucose-Capillary: 108 mg/dL — ABNORMAL HIGH (ref 70–99)
Glucose-Capillary: 110 mg/dL — ABNORMAL HIGH (ref 70–99)
Glucose-Capillary: 111 mg/dL — ABNORMAL HIGH (ref 70–99)
Glucose-Capillary: 113 mg/dL — ABNORMAL HIGH (ref 70–99)
Glucose-Capillary: 113 mg/dL — ABNORMAL HIGH (ref 70–99)
Glucose-Capillary: 114 mg/dL — ABNORMAL HIGH (ref 70–99)
Glucose-Capillary: 116 mg/dL — ABNORMAL HIGH (ref 70–99)
Glucose-Capillary: 124 mg/dL — ABNORMAL HIGH (ref 70–99)
Glucose-Capillary: 125 mg/dL — ABNORMAL HIGH (ref 70–99)
Glucose-Capillary: 126 mg/dL — ABNORMAL HIGH (ref 70–99)
Glucose-Capillary: 126 mg/dL — ABNORMAL HIGH (ref 70–99)
Glucose-Capillary: 129 mg/dL — ABNORMAL HIGH (ref 70–99)
Glucose-Capillary: 138 mg/dL — ABNORMAL HIGH (ref 70–99)
Glucose-Capillary: 141 mg/dL — ABNORMAL HIGH (ref 70–99)
Glucose-Capillary: 141 mg/dL — ABNORMAL HIGH (ref 70–99)
Glucose-Capillary: 190 mg/dL — ABNORMAL HIGH (ref 70–99)
Glucose-Capillary: 81 mg/dL (ref 70–99)
Glucose-Capillary: 82 mg/dL (ref 70–99)
Glucose-Capillary: 84 mg/dL (ref 70–99)
Glucose-Capillary: 85 mg/dL (ref 70–99)
Glucose-Capillary: 85 mg/dL (ref 70–99)
Glucose-Capillary: 85 mg/dL (ref 70–99)
Glucose-Capillary: 87 mg/dL (ref 70–99)
Glucose-Capillary: 88 mg/dL (ref 70–99)
Glucose-Capillary: 91 mg/dL (ref 70–99)
Glucose-Capillary: 92 mg/dL (ref 70–99)
Glucose-Capillary: 95 mg/dL (ref 70–99)
Glucose-Capillary: 96 mg/dL (ref 70–99)
Glucose-Capillary: 97 mg/dL (ref 70–99)
Glucose-Capillary: 98 mg/dL (ref 70–99)
Glucose-Capillary: 100 mg/dL — ABNORMAL HIGH (ref 70–99)
Glucose-Capillary: 101 mg/dL — ABNORMAL HIGH (ref 70–99)
Glucose-Capillary: 103 mg/dL — ABNORMAL HIGH (ref 70–99)
Glucose-Capillary: 104 mg/dL — ABNORMAL HIGH (ref 70–99)
Glucose-Capillary: 104 mg/dL — ABNORMAL HIGH (ref 70–99)
Glucose-Capillary: 105 mg/dL — ABNORMAL HIGH (ref 70–99)
Glucose-Capillary: 106 mg/dL — ABNORMAL HIGH (ref 70–99)
Glucose-Capillary: 109 mg/dL — ABNORMAL HIGH (ref 70–99)
Glucose-Capillary: 110 mg/dL — ABNORMAL HIGH (ref 70–99)
Glucose-Capillary: 112 mg/dL — ABNORMAL HIGH (ref 70–99)
Glucose-Capillary: 116 mg/dL — ABNORMAL HIGH (ref 70–99)
Glucose-Capillary: 117 mg/dL — ABNORMAL HIGH (ref 70–99)
Glucose-Capillary: 117 mg/dL — ABNORMAL HIGH (ref 70–99)
Glucose-Capillary: 119 mg/dL — ABNORMAL HIGH (ref 70–99)
Glucose-Capillary: 120 mg/dL — ABNORMAL HIGH (ref 70–99)
Glucose-Capillary: 120 mg/dL — ABNORMAL HIGH (ref 70–99)
Glucose-Capillary: 126 mg/dL — ABNORMAL HIGH (ref 70–99)
Glucose-Capillary: 127 mg/dL — ABNORMAL HIGH (ref 70–99)
Glucose-Capillary: 128 mg/dL — ABNORMAL HIGH (ref 70–99)
Glucose-Capillary: 133 mg/dL — ABNORMAL HIGH (ref 70–99)
Glucose-Capillary: 138 mg/dL — ABNORMAL HIGH (ref 70–99)
Glucose-Capillary: 143 mg/dL — ABNORMAL HIGH (ref 70–99)
Glucose-Capillary: 152 mg/dL — ABNORMAL HIGH (ref 70–99)
Glucose-Capillary: 155 mg/dL — ABNORMAL HIGH (ref 70–99)
Glucose-Capillary: 79 mg/dL (ref 70–99)
Glucose-Capillary: 80 mg/dL (ref 70–99)
Glucose-Capillary: 81 mg/dL (ref 70–99)
Glucose-Capillary: 83 mg/dL (ref 70–99)
Glucose-Capillary: 85 mg/dL (ref 70–99)
Glucose-Capillary: 86 mg/dL (ref 70–99)
Glucose-Capillary: 87 mg/dL (ref 70–99)
Glucose-Capillary: 88 mg/dL (ref 70–99)
Glucose-Capillary: 88 mg/dL (ref 70–99)
Glucose-Capillary: 91 mg/dL (ref 70–99)
Glucose-Capillary: 91 mg/dL (ref 70–99)
Glucose-Capillary: 93 mg/dL (ref 70–99)
Glucose-Capillary: 93 mg/dL (ref 70–99)
Glucose-Capillary: 94 mg/dL (ref 70–99)

## 2010-08-23 LAB — SAMPLE TO BLOOD BANK

## 2010-08-23 LAB — DIFFERENTIAL
Basophils Absolute: 0 K/uL (ref 0.0–0.1)
Basophils Relative: 0 % (ref 0–1)
Eosinophils Absolute: 0 10*3/uL (ref 0.0–0.7)
Eosinophils Absolute: 0 10*3/uL (ref 0.0–0.7)
Eosinophils Relative: 0 % (ref 0–5)
Lymphocytes Relative: 13 % (ref 12–46)
Lymphocytes Relative: 6 % — ABNORMAL LOW (ref 12–46)
Lymphs Abs: 0.9 10*3/uL (ref 0.7–4.0)
Lymphs Abs: 2 10*3/uL (ref 0.7–4.0)
Monocytes Absolute: 1.6 10*3/uL — ABNORMAL HIGH (ref 0.1–1.0)
Monocytes Relative: 11 % (ref 3–12)
Monocytes Relative: 9 % (ref 3–12)
Neutro Abs: 12.9 K/uL — ABNORMAL HIGH (ref 1.7–7.7)
Neutrophils Relative %: 78 % — ABNORMAL HIGH (ref 43–77)
Neutrophils Relative %: 84 % — ABNORMAL HIGH (ref 43–77)

## 2010-08-23 LAB — IRON AND TIBC
Iron: 37 ug/dL — ABNORMAL LOW (ref 42–135)
UIBC: 88 ug/dL

## 2010-08-23 LAB — BASIC METABOLIC PANEL WITH GFR
BUN: 70 mg/dL — ABNORMAL HIGH (ref 6–23)
Calcium: 10 mg/dL (ref 8.4–10.5)
Creatinine, Ser: 3.02 mg/dL — ABNORMAL HIGH (ref 0.4–1.2)
GFR calc non Af Amer: 17 mL/min — ABNORMAL LOW (ref >60)
Glucose, Bld: 97 mg/dL (ref 70–99)
Sodium: 134 meq/L — ABNORMAL LOW (ref 135–145)

## 2010-08-23 LAB — URINE MICROSCOPIC-ADD ON

## 2010-08-23 LAB — COMPREHENSIVE METABOLIC PANEL
ALT: 16 U/L (ref 0–35)
AST: 15 U/L (ref 0–37)
Albumin: 2.5 g/dL — ABNORMAL LOW (ref 3.5–5.2)
Alkaline Phosphatase: 43 U/L (ref 39–117)
Alkaline Phosphatase: 49 U/L (ref 39–117)
Chloride: 102 mEq/L (ref 96–112)
Chloride: 104 mEq/L (ref 96–112)
GFR calc Af Amer: 20 mL/min — ABNORMAL LOW (ref >60)
Glucose, Bld: 141 mg/dL — ABNORMAL HIGH (ref 70–99)
Potassium: 4.6 mEq/L (ref 3.5–5.1)
Potassium: 4.8 mEq/L (ref 3.5–5.1)
Sodium: 132 mEq/L — ABNORMAL LOW (ref 135–145)
Sodium: 133 mEq/L — ABNORMAL LOW (ref 135–145)
Total Bilirubin: 0.5 mg/dL (ref 0.3–1.2)
Total Protein: 5.4 g/dL — ABNORMAL LOW (ref 6.0–8.3)

## 2010-08-23 LAB — PREPARE FRESH FROZEN PLASMA

## 2010-08-23 LAB — RETICULOCYTES
RBC.: 2.76 MIL/uL — ABNORMAL LOW (ref 3.87–5.11)
Retic Ct Pct: 1.3 % (ref 0.4–3.1)

## 2010-08-23 LAB — PREALBUMIN
Prealbumin: 10.6 mg/dL — ABNORMAL LOW (ref 18.0–45.0)
Prealbumin: 17.6 mg/dL — ABNORMAL LOW (ref 18.0–45.0)

## 2010-08-23 LAB — PREPARE RBC (CROSSMATCH)

## 2010-08-23 LAB — URINE CULTURE

## 2010-08-24 ENCOUNTER — Telehealth: Payer: Self-pay | Admitting: Internal Medicine

## 2010-08-25 NOTE — Progress Notes (Signed)
Summary: pt call to get x-ray results  Phone Note Call from Patient Call back at Home Phone [phone] P PH     Caller: Patient Reason for Call: Talk to Nurse, Talk to Doctor, Lab or Test Results Summary of Call: pt call to get x-ray results  Initial call taken by: Omer Jack,  August 17, 2010 12:09 PM  Follow-up for Phone Call        pt given xray results, hasn't had CT since 06/2009, will sch CT tom and call her back Meredith Staggers, RN  August 17, 2010 5:47 PM   CT sch for 3/16 at [facility], RN  August 18, 2010 1:49 PM

## 2010-08-26 LAB — BASIC METABOLIC PANEL
Chloride: 101 mEq/L (ref 96–112)
Creatinine, Ser: 2.53 mg/dL — ABNORMAL HIGH (ref 0.4–1.2)
GFR calc Af Amer: 25 mL/min — ABNORMAL LOW (ref >60)
GFR calc non Af Amer: 20 mL/min — ABNORMAL LOW (ref >60)
Potassium: 3.7 mEq/L (ref 3.5–5.1)

## 2010-08-26 LAB — CBC
MCV: 91.3 fL (ref 78.0–100.0)
Platelets: 263 10*3/uL (ref 150–400)
RBC: 4.04 MIL/uL (ref 3.87–5.11)
WBC: 13.3 10*3/uL — ABNORMAL HIGH (ref 4.0–10.5)

## 2010-08-28 ENCOUNTER — Telehealth: Payer: Self-pay | Admitting: Internal Medicine

## 2010-08-28 ENCOUNTER — Other Ambulatory Visit: Payer: Self-pay | Admitting: *Deleted

## 2010-08-28 DIAGNOSIS — I10 Essential (primary) hypertension: Secondary | ICD-10-CM

## 2010-08-28 MED ORDER — CLONIDINE HCL 0.2 MG PO TABS: 0.2000 mg | ORAL_TABLET | Freq: Two times a day (BID) | ORAL | Status: AC

## 2010-08-28 MED ORDER — DILTIAZEM HCL ER COATED BEADS 180 MG PO CP24: 180.0000 mg | ORAL_CAPSULE | Freq: Every day | ORAL | Status: AC

## 2010-08-28 NOTE — Telephone Encounter (Signed)
Dr Gala Romney sent Dr Delton Coombes a note in EMR, will let her know when I hear something back

## 2010-08-30 LAB — IRON AND TIBC
Iron: 69 ug/dL (ref 42–135)
Saturation Ratios: 47 % (ref 20–55)
TIBC: 148 ug/dL — ABNORMAL LOW (ref 250–470)
UIBC: 79 ug/dL

## 2010-08-30 LAB — RENAL FUNCTION PANEL
Albumin: 2.6 g/dL — ABNORMAL LOW (ref 3.5–5.2)
Albumin: 2.6 g/dL — ABNORMAL LOW (ref 3.5–5.2)
BUN: 27 mg/dL — ABNORMAL HIGH (ref 6–23)
BUN: 34 mg/dL — ABNORMAL HIGH (ref 6–23)
BUN: 37 mg/dL — ABNORMAL HIGH (ref 6–23)
CO2: 21 mEq/L (ref 19–32)
CO2: 21 mEq/L (ref 19–32)
CO2: 23 mEq/L (ref 19–32)
Calcium: 8.8 mg/dL (ref 8.4–10.5)
Calcium: 9.2 mg/dL (ref 8.4–10.5)
Calcium: 9.2 mg/dL (ref 8.4–10.5)
Chloride: 101 mEq/L (ref 96–112)
Chloride: 103 mEq/L (ref 96–112)
Chloride: 108 mEq/L (ref 96–112)
Chloride: 111 mEq/L (ref 96–112)
Creatinine, Ser: 2.38 mg/dL — ABNORMAL HIGH (ref 0.4–1.2)
Creatinine, Ser: 2.43 mg/dL — ABNORMAL HIGH (ref 0.4–1.2)
Creatinine, Ser: 2.59 mg/dL — ABNORMAL HIGH (ref 0.4–1.2)
Creatinine, Ser: 2.81 mg/dL — ABNORMAL HIGH (ref 0.4–1.2)
GFR calc Af Amer: 22 mL/min — ABNORMAL LOW (ref >60)
GFR calc Af Amer: 24 mL/min — ABNORMAL LOW (ref >60)
GFR calc Af Amer: 27 mL/min — ABNORMAL LOW (ref >60)
GFR calc non Af Amer: 18 mL/min — ABNORMAL LOW (ref >60)
GFR calc non Af Amer: 20 mL/min — ABNORMAL LOW (ref >60)
GFR calc non Af Amer: 22 mL/min — ABNORMAL LOW (ref >60)
Glucose, Bld: 100 mg/dL — ABNORMAL HIGH (ref 70–99)
Glucose, Bld: 89 mg/dL (ref 70–99)
Glucose, Bld: 90 mg/dL (ref 70–99)
Glucose, Bld: 95 mg/dL (ref 70–99)
Phosphorus: 3.5 mg/dL (ref 2.3–4.6)
Phosphorus: 5.5 mg/dL — ABNORMAL HIGH (ref 2.3–4.6)
Potassium: 2.9 mEq/L — ABNORMAL LOW (ref 3.5–5.1)
Potassium: 3.3 mEq/L — ABNORMAL LOW (ref 3.5–5.1)
Potassium: 5.4 mEq/L — ABNORMAL HIGH (ref 3.5–5.1)
Sodium: 136 mEq/L (ref 135–145)
Sodium: 136 mEq/L (ref 135–145)
Sodium: 140 mEq/L (ref 135–145)

## 2010-08-30 LAB — CBC
HCT: 20.7 % — ABNORMAL LOW (ref 36.0–46.0)
HCT: 22 % — ABNORMAL LOW (ref 36.0–46.0)
HCT: 22.5 % — ABNORMAL LOW (ref 36.0–46.0)
HCT: 29.3 % — ABNORMAL LOW (ref 36.0–46.0)
Hemoglobin: 6.9 g/dL — CL (ref 12.0–15.0)
Hemoglobin: 7.3 g/dL — ABNORMAL LOW (ref 12.0–15.0)
Hemoglobin: 7.5 g/dL — ABNORMAL LOW (ref 12.0–15.0)
Hemoglobin: 9.8 g/dL — ABNORMAL LOW (ref 12.0–15.0)
MCHC: 33.3 g/dL (ref 30.0–36.0)
MCHC: 33.5 g/dL (ref 30.0–36.0)
MCHC: 33.5 g/dL (ref 30.0–36.0)
MCV: 91 fL (ref 78.0–100.0)
MCV: 91.1 fL (ref 78.0–100.0)
MCV: 91.2 fL (ref 78.0–100.0)
MCV: 91.6 fL (ref 78.0–100.0)
Platelets: 169 10*3/uL (ref 150–400)
Platelets: 183 10*3/uL (ref 150–400)
Platelets: 195 10*3/uL (ref 150–400)
RBC: 2.41 MIL/uL — ABNORMAL LOW (ref 3.87–5.11)
RBC: 2.46 MIL/uL — ABNORMAL LOW (ref 3.87–5.11)
RBC: 3.22 MIL/uL — ABNORMAL LOW (ref 3.87–5.11)
RDW: 16.2 % — ABNORMAL HIGH (ref 11.5–15.5)
RDW: 16.6 % — ABNORMAL HIGH (ref 11.5–15.5)
WBC: 10.1 10*3/uL (ref 4.0–10.5)
WBC: 13.3 10*3/uL — ABNORMAL HIGH (ref 4.0–10.5)
WBC: 8.4 10*3/uL (ref 4.0–10.5)
WBC: 8.5 10*3/uL (ref 4.0–10.5)

## 2010-08-30 LAB — DIFFERENTIAL
Basophils Absolute: 0 10*3/uL (ref 0.0–0.1)
Basophils Relative: 0 % (ref 0–1)
Eosinophils Absolute: 0.1 10*3/uL (ref 0.0–0.7)
Eosinophils Relative: 1 % (ref 0–5)
Lymphocytes Relative: 14 % (ref 12–46)
Lymphs Abs: 1.4 10*3/uL (ref 0.7–4.0)
Monocytes Absolute: 0.7 10*3/uL (ref 0.1–1.0)
Monocytes Relative: 7 % (ref 3–12)
Neutro Abs: 7.9 10*3/uL — ABNORMAL HIGH (ref 1.7–7.7)
Neutrophils Relative %: 78 % — ABNORMAL HIGH (ref 43–77)

## 2010-08-30 LAB — BASIC METABOLIC PANEL
CO2: 26 mEq/L (ref 19–32)
Chloride: 101 mEq/L (ref 96–112)
Creatinine, Ser: 2.7 mg/dL — ABNORMAL HIGH (ref 0.4–1.2)
GFR calc Af Amer: 23 mL/min — ABNORMAL LOW (ref >60)
Potassium: 3.2 mEq/L — ABNORMAL LOW (ref 3.5–5.1)

## 2010-08-30 LAB — FERRITIN: Ferritin: 1554 ng/mL — ABNORMAL HIGH (ref 10–291)

## 2010-09-02 ENCOUNTER — Other Ambulatory Visit: Payer: BC Managed Care – PPO

## 2010-09-02 ENCOUNTER — Encounter: Payer: Self-pay | Admitting: Pulmonary Disease

## 2010-09-02 ENCOUNTER — Ambulatory Visit (INDEPENDENT_AMBULATORY_CARE_PROVIDER_SITE_OTHER): Payer: BC Managed Care – PPO | Admitting: Pulmonary Disease

## 2010-09-02 VITALS — BP 126/90 | HR 105 | Temp 97.2°F | Ht 68.0 in | Wt 160.2 lb

## 2010-09-02 DIAGNOSIS — R05 Cough: Secondary | ICD-10-CM

## 2010-09-02 DIAGNOSIS — J479 Bronchiectasis, uncomplicated: Secondary | ICD-10-CM

## 2010-09-02 DIAGNOSIS — J168 Pneumonia due to other specified infectious organisms: Secondary | ICD-10-CM

## 2010-09-02 DIAGNOSIS — J189 Pneumonia, unspecified organism: Secondary | ICD-10-CM

## 2010-09-02 MED ORDER — TRAMADOL HCL 50 MG PO TABS: 50.0000 mg | ORAL_TABLET | Freq: Two times a day (BID) | ORAL | Status: AC

## 2010-09-02 MED ORDER — AMOXICILLIN-POT CLAVULANATE 875-125 MG PO TABS: 1.0000 | ORAL_TABLET | Freq: Two times a day (BID) | ORAL | Status: AC

## 2010-09-02 NOTE — Patient Instructions (Signed)
Bronchoscopy arranged for Tuesday April 3 rd at 0730 Nothing to eat the night before Please take all your am medications Antibiotic -AUGMENTIN x 10 days SPutum culture test You have BRONCHIECTASIS in your lungs- we discussed what this is

## 2010-09-02 NOTE — Progress Notes (Signed)
  Subjective:    Patient ID: [name], female    DOB: [birth year], 50 y.o.   MRN: [id number]  HPI Cards- bensimhon Nephrologist - Caryn Section 49/F, never smoker with lupus since age 14 complicated by nephritis and CKD, s/p renal transplant x 2, HTN, diast. CHF, AFib/Flutter. She has failed flecainide and amio. was stopped due to possible lung toxicity. She was taken off of coumadin due to h/o fall with large leg hematoma requiring skin grafting. She was admitted to [facility] 1/2-03/2011 with a/c diast. CHF in the setting of AFib with RVR and a/c renal failure. Recently underwent kidney biopsy at [facility] which showed graft rejection and back on transplant list -Dr Nicanor Bake .  She reports a chronic cough x 5 yrs , worse now & productive of green phlegm. She had a full pulmonary w/u at [facility], and the pt states they could find no pulmonary source for the cough. It ultimately resolved on its own ( 80-90% better). The cough restarted in 2009 & she was seen by dr clance in april'10 >> attributed to upper airway cough. There is no aggravating or relieving factors. This is associated with Sob x 4-5 mnths Wt ok, nothing tastes good, she c/o left sided chest pain with cough, tylenol does not seem to relieve  CTc hest - no contrast on 08/18/10 >>Ill-defined central/peribronchovascular reticular nodular opacities  within the left upper and left lower lobe. There is a more  confluent right middle lobe and right lower lobe dependent airspace disease with mild bronchiectasis in the right middle lobe and central right lower lobe. Concurrent pattern of micro nodularity within the right lung base. Age advanced atherosclerosis & osteopenia & rib fractures were also noted.      Review of SystemsPt denies any significant  nasal congestion or excess secretions, fever, chills, sweats, unintended wt loss, pleuritic or exertional cp, orthopnea pnd or leg swelling.  Pt also denies any obvious fluctuation in symptoms with weather or  environmental change or other alleviating or aggravating factors.    Pt denies any increase in rescue therapy over baseline, denies waking up needing it or having early am exacerbations or coughing/wheezing/ or dyspnea       Objective:   Physical Exam    Gen. Pleasant, well-nourished, in no distress, normal affect ENT - no lesions, no post nasal drip, steroid facies Neck: No JVD, no thyromegaly, no carotid bruits Lungs: no use of accessory muscles, no dullness to percussion, Rt infrascapular rales, no rhonchi , reproducible point tenderness on left 7/8 th ribs mid axillary line Cardiovascular: Rhythm regular, heart sounds  normal, no murmurs, no peripheral edema Abdomen: soft and non-tender, no hepatosplenomegaly, BS normal. Musculoskeletal: No deformities, no cyanosis or clubbing Neuro:  alert, non focal Skin:  Warm, no lesions/ rash     Assessment & Plan:

## 2010-09-03 DIAGNOSIS — J189 Pneumonia, unspecified organism: Secondary | ICD-10-CM | POA: Insufficient documentation

## 2010-09-03 DIAGNOSIS — J479 Bronchiectasis, uncomplicated: Secondary | ICD-10-CM | POA: Insufficient documentation

## 2010-09-03 NOTE — Assessment & Plan Note (Signed)
Significant in BLL, this could very well be the cause of her chronic cough. Etiology unclear, need to r/o mycobacterium avium (MAC)

## 2010-09-03 NOTE — Assessment & Plan Note (Signed)
Immunocompromised pt, concern for pseudomonas due to steroid dependency, Other unusual organisms possible Will initiate therapy with augmentin Proceed with bronchoscopy to obtain specimens & transbronchial biopsies. The risks of the procedure including coughing, bleeding and the small chance of lung puncture requiring chest tube were discussed in great detail. The benefits & alternatives including serial follow up were also discussed.

## 2010-09-03 NOTE — Progress Notes (Addendum)
Summary: call for resutls  Phone Note Call from Patient Call back at Home Phone [phone]   Caller: Patient Reason for Call: Talk to Nurse, Talk to Doctor, Lab or Test Results Summary of Call: pt would like CT results Initial call taken by: Omer Jack,  August 24, 2010 1:04 PM  Follow-up for Phone Call        spoke w/pt she is aware of results, have called pulm and sch appt w/Dr Delton Coombes for tue 4/24 at 3pm, she would like to be seen sooner, will have Dr Gala Romney call to see if can be moved up Meredith Staggers, RN  August 24, 2010 2:28 PM

## 2010-09-03 NOTE — Letter (Signed)
Summary: [facility] Patient Note   [facility] Patient Note   Imported By: Roderic Ovens 08/26/2010 10:32:00  _____________________________________________________________________  External Attachment:    Type:   Image     Comment:   External Document

## 2010-09-03 NOTE — Assessment & Plan Note (Signed)
Mucinex DM OK Chest pain likely due to rib fractures/ strain from incessant coughing Tramadol ok for pain since tylenol not working - DO not take NSAIDs

## 2010-09-06 DIAGNOSIS — I129 Hypertensive chronic kidney disease with stage 1 through stage 4 chronic kidney disease, or unspecified chronic kidney disease: Secondary | ICD-10-CM | POA: Diagnosis present

## 2010-09-06 DIAGNOSIS — Z940 Kidney transplant status: Secondary | ICD-10-CM

## 2010-09-06 DIAGNOSIS — Z9049 Acquired absence of other specified parts of digestive tract: Secondary | ICD-10-CM

## 2010-09-06 DIAGNOSIS — E669 Obesity, unspecified: Secondary | ICD-10-CM | POA: Diagnosis present

## 2010-09-06 DIAGNOSIS — I4891 Unspecified atrial fibrillation: Secondary | ICD-10-CM | POA: Diagnosis present

## 2010-09-06 DIAGNOSIS — D631 Anemia in chronic kidney disease: Secondary | ICD-10-CM | POA: Diagnosis present

## 2010-09-06 DIAGNOSIS — N179 Acute kidney failure, unspecified: Secondary | ICD-10-CM | POA: Diagnosis not present

## 2010-09-06 DIAGNOSIS — A0472 Enterocolitis due to Clostridium difficile, not specified as recurrent: Secondary | ICD-10-CM | POA: Diagnosis not present

## 2010-09-06 DIAGNOSIS — E872 Acidosis, unspecified: Secondary | ICD-10-CM | POA: Diagnosis not present

## 2010-09-06 DIAGNOSIS — Z79899 Other long term (current) drug therapy: Secondary | ICD-10-CM

## 2010-09-06 DIAGNOSIS — I5033 Acute on chronic diastolic (congestive) heart failure: Secondary | ICD-10-CM | POA: Diagnosis present

## 2010-09-06 DIAGNOSIS — D638 Anemia in other chronic diseases classified elsewhere: Secondary | ICD-10-CM | POA: Diagnosis present

## 2010-09-06 DIAGNOSIS — N184 Chronic kidney disease, stage 4 (severe): Secondary | ICD-10-CM | POA: Diagnosis present

## 2010-09-06 DIAGNOSIS — E876 Hypokalemia: Secondary | ICD-10-CM | POA: Diagnosis not present

## 2010-09-06 DIAGNOSIS — J441 Chronic obstructive pulmonary disease with (acute) exacerbation: Secondary | ICD-10-CM | POA: Diagnosis present

## 2010-09-06 DIAGNOSIS — I509 Heart failure, unspecified: Secondary | ICD-10-CM | POA: Diagnosis present

## 2010-09-06 DIAGNOSIS — M329 Systemic lupus erythematosus, unspecified: Secondary | ICD-10-CM | POA: Diagnosis present

## 2010-09-06 DIAGNOSIS — J189 Pneumonia, unspecified organism: Principal | ICD-10-CM | POA: Diagnosis present

## 2010-09-06 DIAGNOSIS — IMO0002 Reserved for concepts with insufficient information to code with codable children: Secondary | ICD-10-CM

## 2010-09-07 ENCOUNTER — Emergency Department (HOSPITAL_COMMUNITY): Payer: BC Managed Care – PPO

## 2010-09-07 ENCOUNTER — Telehealth: Payer: Self-pay | Admitting: Pulmonary Disease

## 2010-09-07 ENCOUNTER — Inpatient Hospital Stay (HOSPITAL_COMMUNITY)
Admission: EM | Admit: 2010-09-07 | Discharge: 2010-09-13 | DRG: 541 | Disposition: A | Discharge status: Home / Self Care | Payer: BC Managed Care – PPO | Attending: Internal Medicine | Admitting: Internal Medicine

## 2010-09-07 DIAGNOSIS — J9600 Acute respiratory failure, unspecified whether with hypoxia or hypercapnia: Secondary | ICD-10-CM

## 2010-09-07 DIAGNOSIS — J471 Bronchiectasis with (acute) exacerbation: Secondary | ICD-10-CM

## 2010-09-07 LAB — CK TOTAL AND CKMB (NOT AT ARMC)
CK, MB: 1.2 ng/mL (ref 0.3–4.0)
Total CK: 12 U/L (ref 7–177)

## 2010-09-07 LAB — CBC
HCT: 23 % — ABNORMAL LOW (ref 36.0–46.0)
HCT: 24.6 % — ABNORMAL LOW (ref 36.0–46.0)
HCT: 25.5 % — ABNORMAL LOW (ref 36.0–46.0)
Hemoglobin: 7 g/dL — ABNORMAL LOW (ref 12.0–15.0)
Hemoglobin: 7.8 g/dL — ABNORMAL LOW (ref 12.0–15.0)
Hemoglobin: 8.5 g/dL — ABNORMAL LOW (ref 12.0–15.0)
MCH: 28.6 pg (ref 26.0–34.0)
MCH: 28.7 pg (ref 26.0–34.0)
MCHC: 30.4 g/dL (ref 30.0–36.0)
MCHC: 30.2 g/dL (ref 30.0–36.0)
MCHC: 33.4 g/dL (ref 30.0–36.0)
MCHC: 33.4 g/dL (ref 30.0–36.0)
MCV: 93.9 fL (ref 78.0–100.0)
MCV: 93.1 fL (ref 78.0–100.0)
MCV: 93.3 fL (ref 78.0–100.0)
MCV: 94.4 fL (ref 78.0–100.0)
Platelets: 298 10*3/uL (ref 150–400)
Platelets: 334 10*3/uL (ref 150–400)
Platelets: 394 10*3/uL (ref 150–400)
RBC: 2.45 MIL/uL — ABNORMAL LOW (ref 3.87–5.11)
RBC: 2.51 MIL/uL — ABNORMAL LOW (ref 3.87–5.11)
RBC: 2.61 MIL/uL — ABNORMAL LOW (ref 3.87–5.11)
RDW: 15.6 % — ABNORMAL HIGH (ref 11.5–15.5)
RDW: 14.9 % (ref 11.5–15.5)
RDW: 15.1 % (ref 11.5–15.5)
WBC: 10.9 10*3/uL — ABNORMAL HIGH (ref 4.0–10.5)
WBC: 15.2 10*3/uL — ABNORMAL HIGH (ref 4.0–10.5)
WBC: 16 10*3/uL — ABNORMAL HIGH (ref 4.0–10.5)
WBC: 9.7 10*3/uL (ref 4.0–10.5)

## 2010-09-07 LAB — BASIC METABOLIC PANEL
BUN: 24 mg/dL — ABNORMAL HIGH (ref 6–23)
BUN: 31 mg/dL — ABNORMAL HIGH (ref 6–23)
CO2: 21 mEq/L (ref 19–32)
CO2: 20 mEq/L (ref 19–32)
Calcium: 9.4 mg/dL (ref 8.4–10.5)
Calcium: 8.7 mg/dL (ref 8.4–10.5)
Calcium: 9.1 mg/dL (ref 8.4–10.5)
Chloride: 101 mEq/L (ref 96–112)
Chloride: 108 mEq/L (ref 96–112)
Chloride: 109 mEq/L (ref 96–112)
Chloride: 114 mEq/L — ABNORMAL HIGH (ref 96–112)
Creatinine, Ser: 2.51 mg/dL — ABNORMAL HIGH (ref 0.4–1.2)
Creatinine, Ser: 3.14 mg/dL — ABNORMAL HIGH (ref 0.4–1.2)
Creatinine, Ser: 3.14 mg/dL — ABNORMAL HIGH (ref 0.4–1.2)
GFR calc Af Amer: 25 mL/min — ABNORMAL LOW (ref >60)
GFR calc Af Amer: 19 mL/min — ABNORMAL LOW (ref >60)
GFR calc Af Amer: 21 mL/min — ABNORMAL LOW (ref >60)
GFR calc non Af Amer: 21 mL/min — ABNORMAL LOW (ref >60)
GFR calc non Af Amer: 16 mL/min — ABNORMAL LOW (ref >60)
GFR calc non Af Amer: 16 mL/min — ABNORMAL LOW (ref >60)
Glucose, Bld: 110 mg/dL — ABNORMAL HIGH (ref 70–99)
Potassium: 3.3 mEq/L — ABNORMAL LOW (ref 3.5–5.1)
Potassium: 3.4 mEq/L — ABNORMAL LOW (ref 3.5–5.1)
Potassium: 4.1 mEq/L (ref 3.5–5.1)
Sodium: 135 mEq/L (ref 135–145)
Sodium: 132 mEq/L — ABNORMAL LOW (ref 135–145)
Sodium: 138 mEq/L (ref 135–145)
Sodium: 140 mEq/L (ref 135–145)

## 2010-09-07 LAB — COMPREHENSIVE METABOLIC PANEL
AST: 12 U/L (ref 0–37)
Albumin: 3.2 g/dL — ABNORMAL LOW (ref 3.5–5.2)
Alkaline Phosphatase: 61 U/L (ref 39–117)
BUN: 73 mg/dL — ABNORMAL HIGH (ref 6–23)
GFR calc Af Amer: 25 mL/min — ABNORMAL LOW (ref >60)
Potassium: 3.5 mEq/L (ref 3.5–5.1)
Total Protein: 6.4 g/dL (ref 6.0–8.3)

## 2010-09-07 LAB — RENAL FUNCTION PANEL
Albumin: 2.6 g/dL — ABNORMAL LOW (ref 3.5–5.2)
Albumin: 2.6 g/dL — ABNORMAL LOW (ref 3.5–5.2)
BUN: 22 mg/dL (ref 6–23)
BUN: 37 mg/dL — ABNORMAL HIGH (ref 6–23)
CO2: 16 mEq/L — ABNORMAL LOW (ref 19–32)
CO2: 23 mEq/L (ref 19–32)
Calcium: 9.6 mg/dL (ref 8.4–10.5)
Chloride: 110 mEq/L (ref 96–112)
Chloride: 111 mEq/L (ref 96–112)
Chloride: 95 mEq/L — ABNORMAL LOW (ref 96–112)
Creatinine, Ser: 2.85 mg/dL — ABNORMAL HIGH (ref 0.4–1.2)
Creatinine, Ser: 3.56 mg/dL — ABNORMAL HIGH (ref 0.4–1.2)
GFR calc Af Amer: 17 mL/min — ABNORMAL LOW (ref >60)
GFR calc Af Amer: 22 mL/min — ABNORMAL LOW (ref >60)
GFR calc non Af Amer: 14 mL/min — ABNORMAL LOW (ref >60)
GFR calc non Af Amer: 18 mL/min — ABNORMAL LOW (ref >60)
Glucose, Bld: 110 mg/dL — ABNORMAL HIGH (ref 70–99)
Potassium: 2.7 mEq/L — CL (ref 3.5–5.1)
Potassium: 3.9 mEq/L (ref 3.5–5.1)
Sodium: 138 mEq/L (ref 135–145)

## 2010-09-07 LAB — PROTIME-INR
INR: 1.18 (ref 0.00–1.49)
INR: 1.26 (ref 0.00–1.49)
INR: 2.21 — ABNORMAL HIGH (ref 0.00–1.49)
Prothrombin Time: 14.4 seconds (ref 11.6–15.2)
Prothrombin Time: 15.7 seconds — ABNORMAL HIGH (ref 11.6–15.2)
Prothrombin Time: 24.3 seconds — ABNORMAL HIGH (ref 11.6–15.2)

## 2010-09-07 LAB — CLOSTRIDIUM DIFFICILE EIA: C difficile Toxins A+B, EIA: NEGATIVE

## 2010-09-07 LAB — CULTURE, BLOOD (ROUTINE X 2)
Culture: NO GROWTH
Culture: NO GROWTH

## 2010-09-07 LAB — URINE CULTURE
Colony Count: NO GROWTH
Culture: NO GROWTH

## 2010-09-07 LAB — CARDIAC PANEL(CRET KIN+CKTOT+MB+TROPI)
CK, MB: 1 ng/mL (ref 0.3–4.0)
CK, MB: 1.1 ng/mL (ref 0.3–4.0)
Relative Index: INVALID (ref 0.0–2.5)
Relative Index: INVALID (ref 0.0–2.5)
Total CK: 13 U/L (ref 7–177)
Troponin I: 0.03 ng/mL (ref 0.00–0.06)
Troponin I: 0.05 ng/mL (ref 0.00–0.06)
Troponin I: 0.05 ng/mL (ref 0.00–0.06)

## 2010-09-07 LAB — HEPARIN LEVEL (UNFRACTIONATED): Heparin Unfractionated: 0.31 IU/mL (ref 0.30–0.70)

## 2010-09-07 LAB — DIFFERENTIAL
Band Neutrophils: 0 % (ref 0–10)
Basophils Absolute: 0 10*3/uL (ref 0.0–0.1)
Basophils Relative: 0 % (ref 0–1)
Basophils Relative: 0 % (ref 0–1)
Eosinophils Absolute: 0.2 10*3/uL (ref 0.0–0.7)
Eosinophils Relative: 1 % (ref 0–5)
Lymphocytes Relative: 6 % — ABNORMAL LOW (ref 12–46)
Monocytes Relative: 6 % (ref 3–12)
Myelocytes: 0 %
Neutro Abs: 14.7 10*3/uL — ABNORMAL HIGH (ref 1.7–7.7)
Neutrophils Relative %: 87 % — ABNORMAL HIGH (ref 43–77)
Promyelocytes Absolute: 0 %

## 2010-09-07 LAB — PHOSPHORUS: Phosphorus: 5.2 mg/dL — ABNORMAL HIGH (ref 2.3–4.6)

## 2010-09-07 LAB — BRAIN NATRIURETIC PEPTIDE: Pro B Natriuretic peptide (BNP): 1570 pg/mL — ABNORMAL HIGH (ref 0.0–100.0)

## 2010-09-07 LAB — TSH: TSH: 3.454 u[IU]/mL (ref 0.350–4.500)

## 2010-09-07 LAB — ANA: Anti Nuclear Antibody(ANA): NEGATIVE

## 2010-09-07 LAB — MAGNESIUM: Magnesium: 2 mg/dL (ref 1.5–2.5)

## 2010-09-07 LAB — MRSA PCR SCREENING: MRSA by PCR: NEGATIVE

## 2010-09-07 NOTE — Telephone Encounter (Signed)
[name] spoke with Dr. Vassie Loll and he states cancel the bronch. Pt husband advised this will be cancelled and called and spoke with resp and advised them the bronch is cancelled

## 2010-09-08 ENCOUNTER — Encounter (HOSPITAL_COMMUNITY): Payer: BC Managed Care – PPO

## 2010-09-08 LAB — BASIC METABOLIC PANEL
BUN: 72 mg/dL — ABNORMAL HIGH (ref 6–23)
Creatinine, Ser: 2.67 mg/dL — ABNORMAL HIGH (ref 0.4–1.2)
GFR calc non Af Amer: 19 mL/min — ABNORMAL LOW (ref >60)
Glucose, Bld: 151 mg/dL — ABNORMAL HIGH (ref 70–99)
Potassium: 3.9 mEq/L (ref 3.5–5.1)

## 2010-09-08 LAB — CBC
HCT: 24.3 % — ABNORMAL LOW (ref 36.0–46.0)
Hemoglobin: 7.7 g/dL — ABNORMAL LOW (ref 12.0–15.0)
MCH: 29.2 pg (ref 26.0–34.0)
MCHC: 31.7 g/dL (ref 30.0–36.0)
MCV: 92 fL (ref 78.0–100.0)
Platelets: 191 10*3/uL (ref 150–400)
RBC: 2.64 MIL/uL — ABNORMAL LOW (ref 3.87–5.11)
RDW: 15.2 % (ref 11.5–15.5)
WBC: 8 10*3/uL (ref 4.0–10.5)

## 2010-09-08 LAB — DIFFERENTIAL
Eosinophils Absolute: 0 10*3/uL (ref 0.0–0.7)
Eosinophils Relative: 0 % (ref 0–5)
Lymphocytes Relative: 2 % — ABNORMAL LOW (ref 12–46)
Lymphs Abs: 0.2 10*3/uL — ABNORMAL LOW (ref 0.7–4.0)
Monocytes Absolute: 0.2 10*3/uL (ref 0.1–1.0)
Monocytes Relative: 2 % — ABNORMAL LOW (ref 3–12)

## 2010-09-08 LAB — TYPE AND SCREEN
Antibody Screen: NEGATIVE
Unit division: 0

## 2010-09-09 ENCOUNTER — Inpatient Hospital Stay (HOSPITAL_COMMUNITY): Payer: BC Managed Care – PPO

## 2010-09-09 LAB — CBC
Hemoglobin: 7.6 g/dL — ABNORMAL LOW (ref 12.0–15.0)
MCV: 92.9 fL (ref 78.0–100.0)
Platelets: 203 10*3/uL (ref 150–400)
RBC: 2.66 MIL/uL — ABNORMAL LOW (ref 3.87–5.11)
WBC: 8 10*3/uL (ref 4.0–10.5)

## 2010-09-09 LAB — BASIC METABOLIC PANEL
BUN: 75 mg/dL — ABNORMAL HIGH (ref 6–23)
Chloride: 103 mEq/L (ref 96–112)
GFR calc Af Amer: 22 mL/min — ABNORMAL LOW (ref >60)
Potassium: 3.6 mEq/L (ref 3.5–5.1)

## 2010-09-09 LAB — URINALYSIS, ROUTINE W REFLEX MICROSCOPIC
Nitrite: NEGATIVE
Specific Gravity, Urine: 1.02 (ref 1.005–1.030)
Urobilinogen, UA: 0.2 mg/dL (ref 0.0–1.0)

## 2010-09-09 LAB — URINE MICROSCOPIC-ADD ON

## 2010-09-09 NOTE — H&P (Signed)
NAMEMARISE, [name] NO.:  [id number]  MEDICAL RECORD NO.:  [id number]           PATIENT TYPE:  I  LOCATION:  1233                         FACILITY:  [facility]  PHYSICIAN:  Della Goo, M.D. DATE OF BIRTH:  1960-12-15  DATE OF ADMISSION:  09/07/2010 DATE OF DISCHARGE:                             HISTORY & PHYSICAL   DATE OF ADMISSION:  September 07, 2010.  PRIMARY CARE PHYSICIAN:  [facility].  CARDIOLOGIST:  Bevelyn Buckles. Bensimhon, M.D.  CHIEF COMPLAINT:  Shortness of breath and worsening cough.  HISTORY OF PRESENT ILLNESS:  This is a 50 year old female with multiple medical problems which include two previous renal transplants on chronic prednisone therapy along with systemic lupus erythematosus who presented to the Emergency Department secondary to worsening shortness of breath and worsening cough.  She reports worsening over the past 3 weeks and even more so this afternoon.  She reports overall she has had a cough for 5 years and was scheduled to see Pulmonology on Tuesday, April 3 with pulmonologist, Dr. Vassie Loll.  The cough has changed in character.  She reports now that she is coughing up greenish sputum.  She denies having any chest pain.  The patient also reports having dyspnea on exertion.  In the Emergency Department the patient was evaluated and a chest x-ray was performed which did reveal a left lower lobe pneumonia.  Also when the patient arrived the patient was also found to be in atrial fibrillation with a rapid ventricular response and was started on a Cardizem drip.  Other laboratory abnormalities.  The patient was found to have a beta natriuretic peptide of 1570.0 and also an elevated BUN and creatinine of 73/2.52.  The patient was administered 80 mg of IV Lasix times one dose as well and she was referred for medical admission.  PAST MEDICAL HISTORY:  As mentioned above two previous renal transplants, the first cadaveric, the  second from a live donor.  She also has chronic renal insufficiency and sees [facility].  The nephrologist is Dr. Marina Gravel.  She has systemic lupus erythematosus and is on chronic steroid therapy, also a history of atrial fibrillation and chronic congestive heart failure syndrome.  MEDICATIONS:  The patient's medications at this time include: 1. Prednisone 5 mg one p.o. b.i.d. 2. Calcium tablets one p.o. daily. 3. Hectorol 0.5 mcg one p.o. daily. 4. Myfortic oral 360 mg three times a day. 5. Potassium chloride 20 mEq one p.o. daily. 6. Lasix 40 mg q.a.m. and 80 mg p.o. q.p.m. 7. Diltiazem 180 mg one p.o. daily. 8. Tessalon Perles 100 mg one p.o. t.i.d. 9. Klonopin p.r.n. 10.Augmentin started on March 28 twice daily. 11.Metoprolol tartrate dose needs to be further verified twice daily. 12.Tramadol dose needs to be further verified twice daily. 13.Clonidine 0.2 mg one p.o. b.i.d. 14.Omeprazole 20 mg one p.o. daily.  ALLERGIES:  COMPAZINE which causes seizures and an allergy listed per the medical records for SULFA.  The patient is not aware of this allergy.  This may actually be related to her renal insufficiency.  SOCIAL HISTORY:  The patient is married.  She is a nonsmoker and nondrinker and has no history of illicit drug usage.  FAMILY HISTORY:  Noncontributory.  REVIEW OF SYSTEMS:  Pertinents as mentioned above in the HPI.  All other systems are negative.  PHYSICAL EXAMINATION FINDINGS:  GENERAL:  This is an older than stated age-appearing, 50 year old, obese, Caucasian female who is in discomfort but no acute distress currently. VITAL SIGNS:  Temperature 97.8, blood pressure initially 184/111, heart rate 129, respirations 22 and O2 saturations 95%. HEENT EXAMINATION:  Normocephalic, atraumatic.  Pupils equally round and reactive to light.  Extraocular movements are intact.  Funduscopic benign.  There is no scleral icterus.  Nares are patent  bilaterally. Oropharynx clear. NECK:  Supple with full range of motion.  No thyromegaly, adenopathy or jugular venous distention. CARDIOVASCULAR:  Regular rate and rhythm.  No murmurs, gallops or rubs appreciated. LUNGS:  With rhonchorous breath sounds.  No rales or wheezes appreciated.  Lungs with normal excursion and symmetric and unlabored breathing. ABDOMEN:  Positive bowel sounds, soft, nontender, nondistended.  No hepatosplenomegaly. EXTREMITIES:  Without cyanosis, clubbing or edema.  The patient has multiple scars on the distal aspect of the left lower extremity along the anterior and medial aspect consistent with a history, she states, of a hematoma and intramuscular bleed while she had been on Coumadin therapy followed by hematoma excision and I and D along with skin grafting procedure of that leg.  She also has callus formation along the calcaneus. NEUROLOGIC EXAMINATION:  Generalized weakness, but otherwise there are no focal deficits.  LABORATORY STUDIES:  White blood cell count 16.9, hemoglobin 7.0, hematocrit 23.0, MCV 93.9, platelets 288, neutrophils 87%, lymphocytes 6%.  Sodium 134, potassium 3.5, chloride 99, CO2 22, BUN 73, creatinine 2.52 and glucose 129.  Albumin 3.2, AST 12, ALT 13, alkaline phosphatase 61, total bilirubin 0.6.  Total creatinine kinase 12, CK-MB 1.2, troponin 0.06.  Beta natriuretic peptide 1570.0.  Prothrombin time 14.4, INR 1.10.  RADIOLOGIC:  Chest x-ray reveals new left base opacities consistent with atelectasis and/or infiltrate.  EKG reveals atrial fibrillation with rapid ventricular rate at a rate of 111.  ASSESSMENT:  A 50 year old female being admitted with: 1. Shortness of breath. 2. Atrial fibrillation with rapid ventricular rate. 3. Left lower lobe infiltrate, most likely a healthcare-acquired     pneumonia. 4. Acute-on-chronic diastolic congestive heart failure syndrome. 5. Chronic renal insufficiency/chronic kidney disease and  history of     renal transplants. 6. Systemic lupus erythematosus on chronic prednisone therapy.  PLAN:  The patient will be admitted to the ICU and continued on the IV Cardizem drip.  Cardiac enzymes will be performed.  The patient has been placed on nitroglycerin paste therapy and supplemental oxygen.  Blood cultures x2 have been ordered and the patient has been placed on antibiotic therapy of IV vancomycin and Zosyn to cover her healthcare- acquired pneumonia.  The patient has been given IV Lasix 80 mg times one dose in the Emergency Department.  Her regular medications will be further reconciled and nebulizer treatments will also be ordered.  The Pulmonary service needs to be notified of this patient's admission since she has an appointment on April 3, which is tomorrow.  Also the [facility] needs to be notified as well of this patient's admission.  The patient is a Full Code and further workup will ensue pending results of the patient's clinical course and her studies.     Della Goo, M.D.     HJ/MEDQ  D:  09/07/2010  T:  09/07/2010  Job:  [id number]  Electronically Signed by Della Goo M.D. on 09/09/2010 06:31:09 AM

## 2010-09-10 DIAGNOSIS — J471 Bronchiectasis with (acute) exacerbation: Secondary | ICD-10-CM

## 2010-09-10 DIAGNOSIS — J9600 Acute respiratory failure, unspecified whether with hypoxia or hypercapnia: Secondary | ICD-10-CM

## 2010-09-10 LAB — BASIC METABOLIC PANEL
BUN: 78 mg/dL — ABNORMAL HIGH (ref 6–23)
GFR calc non Af Amer: 17 mL/min — ABNORMAL LOW (ref >60)
Glucose, Bld: 158 mg/dL — ABNORMAL HIGH (ref 70–99)
Potassium: 3.2 mEq/L — ABNORMAL LOW (ref 3.5–5.1)

## 2010-09-10 LAB — DIFFERENTIAL
Eosinophils Relative: 0 % (ref 0–5)
Lymphs Abs: 0.1 10*3/uL — ABNORMAL LOW (ref 0.7–4.0)
Monocytes Relative: 5 % (ref 3–12)
Neutro Abs: 6.8 10*3/uL (ref 1.7–7.7)

## 2010-09-10 LAB — RENAL FUNCTION PANEL
Albumin: 3 g/dL — ABNORMAL LOW (ref 3.5–5.2)
BUN: 77 mg/dL — ABNORMAL HIGH (ref 6–23)
CO2: 20 mEq/L (ref 19–32)
Chloride: 106 mEq/L (ref 96–112)
Creatinine, Ser: 3.02 mg/dL — ABNORMAL HIGH (ref 0.4–1.2)

## 2010-09-10 LAB — HEMOCCULT GUIAC POC 1CARD (OFFICE): Fecal Occult Bld: NEGATIVE

## 2010-09-10 LAB — CBC
HCT: 25.4 % — ABNORMAL LOW (ref 36.0–46.0)
HCT: 22.2 % — ABNORMAL LOW (ref 36.0–46.0)
Hemoglobin: 7.3 g/dL — CL (ref 12.0–15.0)
MCHC: 32.9 g/dL (ref 30.0–36.0)
MCV: 92.4 fL (ref 78.0–100.0)
MCV: 92.9 fL (ref 78.0–100.0)
RBC: 2.39 MIL/uL — ABNORMAL LOW (ref 3.87–5.11)
RDW: 15.1 % (ref 11.5–15.5)
WBC: 7.3 10*3/uL (ref 4.0–10.5)

## 2010-09-10 LAB — CROSSMATCH
ABO/RH(D): O POS
Antibody Screen: NEGATIVE

## 2010-09-10 LAB — FERRITIN: Ferritin: 534 ng/mL — ABNORMAL HIGH (ref 10–291)

## 2010-09-11 ENCOUNTER — Inpatient Hospital Stay (HOSPITAL_COMMUNITY): Payer: BC Managed Care – PPO

## 2010-09-11 LAB — RENAL FUNCTION PANEL
CO2: 18 mEq/L — ABNORMAL LOW (ref 19–32)
Calcium: 9.5 mg/dL (ref 8.4–10.5)
Chloride: 107 mEq/L (ref 96–112)
Creatinine, Ser: 2.83 mg/dL — ABNORMAL HIGH (ref 0.4–1.2)
Glucose, Bld: 132 mg/dL — ABNORMAL HIGH (ref 70–99)
Sodium: 136 mEq/L (ref 135–145)

## 2010-09-11 LAB — HEMOCCULT GUIAC POC 1CARD (OFFICE): Fecal Occult Bld: NEGATIVE

## 2010-09-11 LAB — CLOSTRIDIUM DIFFICILE BY PCR

## 2010-09-12 LAB — DIFFERENTIAL
Basophils Relative: 0 % (ref 0–1)
Eosinophils Absolute: 0 10*3/uL (ref 0.0–0.7)
Lymphocytes Relative: 6 % — ABNORMAL LOW (ref 12–46)
Neutrophils Relative %: 90 % — ABNORMAL HIGH (ref 43–77)

## 2010-09-12 LAB — BASIC METABOLIC PANEL
BUN: 73 mg/dL — ABNORMAL HIGH (ref 6–23)
Chloride: 114 mEq/L — ABNORMAL HIGH (ref 96–112)
GFR calc Af Amer: 21 mL/min — ABNORMAL LOW (ref >60)
GFR calc non Af Amer: 17 mL/min — ABNORMAL LOW (ref >60)
Potassium: 4.4 mEq/L (ref 3.5–5.1)
Sodium: 140 mEq/L (ref 135–145)

## 2010-09-12 LAB — CBC
MCV: 93.4 fL (ref 78.0–100.0)
Platelets: 233 10*3/uL (ref 150–400)
RBC: 3.17 MIL/uL — ABNORMAL LOW (ref 3.87–5.11)
RDW: 15.3 % (ref 11.5–15.5)
WBC: 10.8 10*3/uL — ABNORMAL HIGH (ref 4.0–10.5)

## 2010-09-13 LAB — MAGNESIUM: Magnesium: 2.3 mg/dL (ref 1.5–2.5)

## 2010-09-13 LAB — DIFFERENTIAL
Basophils Absolute: 0 10*3/uL (ref 0.0–0.1)
Lymphocytes Relative: 3 % — ABNORMAL LOW (ref 12–46)
Monocytes Absolute: 1 10*3/uL (ref 0.1–1.0)
Monocytes Relative: 10 % (ref 3–12)
Neutro Abs: 8.4 10*3/uL — ABNORMAL HIGH (ref 1.7–7.7)
Neutrophils Relative %: 87 % — ABNORMAL HIGH (ref 43–77)

## 2010-09-13 LAB — CBC
HCT: 27.8 % — ABNORMAL LOW (ref 36.0–46.0)
Hemoglobin: 8.6 g/dL — ABNORMAL LOW (ref 12.0–15.0)
MCH: 28.9 pg (ref 26.0–34.0)
MCHC: 30.9 g/dL (ref 30.0–36.0)
RBC: 2.98 MIL/uL — ABNORMAL LOW (ref 3.87–5.11)

## 2010-09-13 LAB — RENAL FUNCTION PANEL
CO2: 21 mEq/L (ref 19–32)
Calcium: 9.3 mg/dL (ref 8.4–10.5)
Chloride: 109 mEq/L (ref 96–112)
GFR calc non Af Amer: 19 mL/min — ABNORMAL LOW (ref >60)
Glucose, Bld: 122 mg/dL — ABNORMAL HIGH (ref 70–99)
Sodium: 133 mEq/L — ABNORMAL LOW (ref 135–145)

## 2010-09-13 LAB — CULTURE, BLOOD (ROUTINE X 2): Culture: NO GROWTH

## 2010-09-15 NOTE — Consult Note (Signed)
[name], SIEVERT               ACCOUNT NO.:  [id number]  MEDICAL RECORD NO.:  [id number]           PATIENT TYPE:  I  LOCATION:  1233                         FACILITY:  [facility]  PHYSICIAN:  Cecille Aver, M.D.DATE OF BIRTH:  Oct 10, 1960  DATE OF CONSULTATION: DATE OF DISCHARGE:                                CONSULTATION   REQUESTING PHYSICIAN:  Della Goo, M.D.  REASON FOR CONSULTATION:  Medical needs of a kidney transplantation.  HISTORY OF PRESENT ILLNESS:  [name] is a 50 year old white female with past medical history significant for systemic lupus, hypertension, atrial fibrillation, and also ESRD status post renal transplant x2, the first in 1989 and the second in 2001.  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  In response to that, Myfortic was increased.  Creatinine on August 26, 2010, was 2.29 when she saw Dr. Caryn Section and that went down from 2.56.  The patient is now admitted with shortness of breath, dyspnea on exertion, and productive cough.  She has been diagnosed with pneumonia and also had AFib with RVR.  Creatinine today is 2.52.  We are asked to consult in order to be on board.  Of note, the patient was having cough previous as well as chest x-ray findings, so had Dr. Vassie Loll who is actually planning on doing a bronchoscopy with biopsies today.  Other than the cough, she is complaining of a sore side from coughing, but otherwise is okay.  PAST MEDICAL HISTORY: 1. Systemic lupus which is quiescent. 2. 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  Creatinine has been in the low to mid 2s. 3. Atrial fibrillation. 4. Hypertension. 5. History of hematochezia and also a big leg hematoma in the past, on     Coumadin. 6. History of colovesical fistula, status post surgical repair.  CURRENT MEDICATIONS: 1. Ventolin. 2. Klonopin 0.5 mg q.h.s. 3. Clonidine  0.2 mg b.i.d. 4. Cardizem drip, which is being converted to oral. 5. Lasix 80 mg b.i.d. 6. Given Solu-Cortef initially and now on prednisone at 60 mg daily. 7. Toprol-XL 100 mg daily. 8. Myfortic 360 mg t.i.d. but her home dose is actually q.i.d. and she     has been taking 720 b.i.d. 9. Zosyn. 10.Potassium 20 mEq daily. 11.Zinc and Zofran p.r.n.  ALLERGIES:  COMPAZINE.  SOCIAL HISTORY:  The patient lives with husband.  She denies tobacco, alcohol, or drug use.  FAMILY HISTORY:  Noncontributory.  REVIEW OF SYSTEMS:  Positive for productive cough and side hurting from coughing as well as fatigue.  Negative for nausea, vomiting, abdominal pain, diarrhea, constipation, fever, chills.  Otherwise review of systems is negative.  PHYSICAL EXAMINATION:  GENERAL:  The patient is afebrile. VITAL SIGNS:  Blood pressure 147/89, heart rate 78, respirations 21, oxygen saturation is 99% on 2 L.  She is on diltiazem drip. Today in's and out's are 520 and 600 so far. GENERAL:  In general, the patient is alert and coughing. HEENT:  Pupils are equal, round, and reactive to light. NECK:  There  is no obvious jugular venous distention. LUNGS:  Wheezes bilaterally as well as coarse breath sounds. CARDIOVASCULAR:  Irregularly irregular.  Rate is okay. ABDOMEN:  Soft and nontender.  No pain over kidney. EXTREMITIES:  Revealed no significant peripheral edema. NEURO:  The patient is alert.  Neuro exam is nonfocal.  LABORATORY DATA:  Potassium 4.2.  BUN and creatinine 74 and 2.51. Calcium 9.4.  TSH is normal.  White blood count 16.5.  Hemoglobin 6.2, of note was 7.3 on August 26, 2010.  Aranesp was increased.  BNP is 1570. Chest x-ray shows air space consolidation at the right base and left base possible new infiltrate.  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 1. Renal:  Status post renal transplant.  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  Biopsy     consistent with TMA.  No particular treatment other than continuing     immunosuppressants.  She is on Myfortic 720 b.i.d. and also on     prednisone 10 mg daily which is currently being increased in light     of this pulmonary process. 2. Anemia, possibly multifactorial.  Hemoglobin was 7.3 on August 26, 2010.  The patient was on Aranesp as outpatient, we will add back.     Ferritin was 1200, on no iron. 3. Pulmonary process is unknown etiology.  She did have some kind of     chronic process, now has an acute on chronic process.  She is on     Zosyn and vancomycin.  They want to consider a pulmonary consultant     since they were planning on working the patient as outpatient     tomorrow. 4. BNP increase but no significant lower extremity edema.  Continue on     the home dose of Lasix.  Thank you very much for this consultation.  We will follow with you.          ______________________________ Cecille Aver, M.D.     KAG/MEDQ  D:  09/07/2010  T:  09/07/2010  Job:  [id number] Electronically Signed by Annie Sable M.D. on 09/15/2010 03:38:28 PM

## 2010-09-18 ENCOUNTER — Encounter: Payer: Self-pay | Admitting: Pulmonary Disease

## 2010-09-18 ENCOUNTER — Ambulatory Visit (INDEPENDENT_AMBULATORY_CARE_PROVIDER_SITE_OTHER): Payer: BC Managed Care – PPO | Admitting: Pulmonary Disease

## 2010-09-18 ENCOUNTER — Ambulatory Visit (INDEPENDENT_AMBULATORY_CARE_PROVIDER_SITE_OTHER)
Admission: RE | Admit: 2010-09-18 | Discharge: 2010-09-18 | Disposition: A | Discharge status: Home / Self Care | Payer: BC Managed Care – PPO | Source: Ambulatory Visit | Attending: Pulmonary Disease | Admitting: Pulmonary Disease

## 2010-09-18 VITALS — BP 120/90 | HR 83 | Temp 97.9°F | Wt 162.0 lb

## 2010-09-18 DIAGNOSIS — J168 Pneumonia due to other specified infectious organisms: Secondary | ICD-10-CM

## 2010-09-18 DIAGNOSIS — R05 Cough: Secondary | ICD-10-CM

## 2010-09-18 DIAGNOSIS — J479 Bronchiectasis, uncomplicated: Secondary | ICD-10-CM

## 2010-09-18 DIAGNOSIS — J189 Pneumonia, unspecified organism: Secondary | ICD-10-CM

## 2010-09-18 NOTE — Progress Notes (Signed)
  Subjective:    Patient ID: [name], female    DOB: 05/12/1961, 49 y.o.   MRN: [id number]  HPI Cards- [facility] Nephrologist - Caryn Section 49/F, never smoker for FU of bronchiectasis, pneumonia & rib fractures. SHe has h/o lupus since age 46 complicated by nephritis and CKD, s/p renal transplant x 2, HTN, diast. CHF, AFib/Flutter. She has failed flecainide and amio. was stopped due to possible lung toxicity. She was taken off of coumadin due to h/o fall with large leg hematoma requiring skin grafting. She was admitted to [facility] 1/2-03/2011 with a/c diast. CHF in the setting of AFib with RVR and a/c renal failure. Recently underwent kidney biopsy at [facility] which showed graft rejection and back on transplant list -Dr Nicanor Bake .  She reports a chronic cough x 5 yrs , worse now & productive of green phlegm. She had a full pulmonary w/u at [facility], and the pt states they could find no pulmonary source for the cough. It ultimately resolved on its own ( 80-90% better). The cough restarted in 2009 & she was seen by dr clance in april'10 >> attributed to upper airway cough. she c/o left sided chest pain with cough, tylenol does not seem to relieve  CTc hest - no contrast on 08/18/10 >>Ill-defined central/peribronchovascular reticular nodular opacities  within the left upper and left lower lobe. There is a more confluent right middle lobe and right lower lobe dependent airspace disease with mild bronchiectasis in the right middle lobe and central right lower lobe. Concurrent pattern of micro nodularity within the right lung base. Age advanced atherosclerosis & osteopenia & rib fractures were also noted.  09/18/2010 Hospitalised for pna & RAFibn & treated with IV Abx, complicated by diarrhea treated with flagyl, C. Diff pCr positive CXR 4/4 & 4/13 some improvement Cough dry but still there, pain left chest still there, dyspnea improved but intermittent chest tightness, no fevers    Review of Systems  Constitutional:  Negative for fever and appetite change.  HENT: Negative for nosebleeds, congestion and sneezing.   Eyes: Negative for redness.  Respiratory: Positive for cough. Negative for shortness of breath and wheezing.   Cardiovascular: Positive for chest pain. Negative for palpitations.  Gastrointestinal: Negative for nausea and abdominal pain.  Genitourinary: Negative for dysuria.  Musculoskeletal: Negative for joint swelling.  Skin: Negative for rash.  Neurological: Negative for headaches.       Objective:   Physical Exam    Gen. Pleasant, well-nourished, in no distress, normal affect ENT - no lesions, no post nasal drip, steroid facies Neck: No JVD, no thyromegaly, no carotid bruits Lungs: no use of accessory muscles, no dullness to percussion, Rt infrascapular rales, no rhonchi , reproducible point tenderness on left 7/8 th ribs mid axillary line Cardiovascular: Rhythm regular, heart sounds  normal, no murmurs, no peripheral edema Abdomen: soft and non-tender, no hepatosplenomegaly, BS normal. Musculoskeletal: No deformities, no cyanosis or clubbing Neuro:  alert, non focal Skin:  Warm, no lesions/ rash     Assessment & Plan:

## 2010-09-18 NOTE — Patient Instructions (Addendum)
Trial of symbicort 160 - 2 puffs bid If no better in 1 week, you will call for nebuliser Chest xray today Call if worse Delsym & tessalon for cough Tramadol ok for pain

## 2010-09-19 ENCOUNTER — Other Ambulatory Visit: Payer: Self-pay | Admitting: Internal Medicine

## 2010-09-19 NOTE — Assessment & Plan Note (Signed)
Discussed plan for flare Unclear cause - mAC reminas in the differnetial. Will investigate further based on course. Trial of symbicort since she seems to have persistent symptoms

## 2010-09-19 NOTE — Assessment & Plan Note (Signed)
Appears to be resolving. Completed Abx, this was complicated by C. Diff for which she has completed Rx  too

## 2010-09-19 NOTE — Assessment & Plan Note (Signed)
Symptomatic Rx for now.

## 2010-09-24 NOTE — Discharge Summary (Signed)
[name], [name]               ACCOUNT NO.:  [id number]  MEDICAL RECORD NO.:  [id number]           PATIENT TYPE:  I  LOCATION:  1442                         [facility]ACILITY:  [facility]  PHYSICIAN:  Altha Harm, MDDATE OF BIRTH:  Oct 22, 1960  DATE OF ADMISSION:  09/06/2010 DATE OF DISCHARGE:  09/13/2010                              DISCHARGE SUMMARY   DISCHARGE DISPOSITION:  Home.  FINAL DISCHARGE DIAGNOSES: 1. Acute exacerbation of chronic obstructive pulmonary disease. 2. Community-acquired pneumonia. 3. Clostridium difficile colitis. 4. Acute-on-chronic renal failure. 5. Chronic kidney disease, transplanted stage 4. 6. Anemia, status post transfusion stable. 7. Hypertension. 8. Systemic lupus. 9. Acute-on-chronic diastolic heart failure, fully treated. 10.History of atrial fibrillation, heart rate controlled, not a     Coumadin candidate. 11.History of renal transplant x2. 12.Hypokalemia, resolved. 13.History of vesico-colic fistula, status post partial colectomy in     2010. 14.History of diverticulitis. 15.History of chronic right lower extremity edema.  DISCHARGE MEDICATIONS: 1. Lasix 40 mg p.o. b.i.d. 2. Flagyl 500 mg p.o. q.8 h. for 10 days. 3. Prednisone tapered over 6 days from 30 down to 5, then resume 5 mg     p.o. b.i.d. 4. Aranesp 200 mcg injected subcutaneously twice a week. 5. Tessalon capsules 1 capsule p.o. t.i.d. p.r.n. cough. 6. Clonazepam 0.5 mg p.o. daily. 7. Clonidine 0.2 mg p.o. b.i.d. 8. Diltiazem CD 180 mg p.o. daily. 9. Guaifenesin 1200 mg 1 tablet p.o. b.i.d. 10.Hectorol 0.5 mcg p.o. daily. 11.Metoprolol XL 100 mg p.o. b.i.d. 12.Mycophenolate mofetil 360 mg 2 capsules p.o. b.i.d. 13.Prednisone 5 mg p.o. b.i.d. to be resumed after taper. 14.Potassium chloride 20 mEq p.o. daily. 15.Tylenol Extra Strength 2 tablets p.o. q.6 h. p.r.n. pain. 16.Probiotics 1 capsule p.o. daily.  DISCONTINUED MEDICATIONS: 1. Lasix 80 mg p.o. b.i.d. 2.  Augmentin 875 mg p.o. b.i.d. 3. Imodium 1 capsule p.o. q.4 h. p.r.n.  CONSULTANTS:  Dr. Allena Katz, Nephrology, and Charlaine Dalton. Sherene Sires, MD, FCCP, Pulmonology  PROCEDURES:  None.  DIAGNOSTIC STUDIES: 1. Portable chest x-ray on admission which shows no change in aeration     in the left lung, compared with a prior study in March.  There is a     new left base opacity, consistent with atelectasis or infiltrate. 2. Maxillofacial CT without contrast which shows partial opacification     and anterior left ethmoid sinus air cells, clear sphenoid sinuses,     mild partial opacification of the right mastoid air cells and     possibly inferior mastoid air cells.  The mid left cavity is clear. 3. Two-view chest x-ray on the 4th which shows slight interval     improvement in the airspace disease of the right lung. 4. Renal ultrasound on the 4th which shows no evidence of     hydronephrosis of the transplanted kidney and is a question of very     trace amount of perinephric fluid. 5. Two-view abdominal x-ray on the 6th which shows unremarkable bowelgas pattern, no free intra-abdominal air is seen.  There is a focal     opacity at the right lung base which raises that there is  pneumonia.  There is increased vascular calcification seen.  CODE STATUS:  Full code.  PRIMARY CARE PHYSICIAN:  The patient uses Dr. Marina Gravel, her nephrologist, as her primary care provider.  ALLERGIES:  COMPAZINE.  CHIEF COMPLAINT:  Shortness of breath and worsening cough.  HISTORY OF PRESENT ILLNESS:  Please refer to the H and P dictated by Dr. Lovell Sheehan for details of the HPI, however, in short this is a 50 year old female who had a history of systemic lupus, status post cadaveric allograft of transplanted kidney, and a history of COPD who presents to the emergency room with worsening shortness of breath and worsening cough.  She also reported greenish-sputum production and dyspnea on exertion.  She did not report any  fever.  In the emergency room, the patient was found to have findings on chest x-ray consistent with her left lower lobe pneumonia and the patient was also found to be in atrial fibrillation with a rapid ventricular response.  The patient was then referred to [facility] for further evaluation and management.  HOSPITAL COURSE: 1. Acute exacerbation of COPD and community-acquired pneumonia.  The     patient was found to have a left lower lobe pneumonia which likely     contributed to the acute exacerbation of her COPD.  She was treated     with a 7-day course of antibiotics, initially included vancomycin     and Zosyn, and then transitioned over to Avelox.  Please note the     patient had been hospitalized within the last 90 days, thus she was     treated with a healthcare-acquired pneumonia.  The patient was     taken off IV antibiotics and transitioned over to oral antibiotics,     Avelox to complete her course.  The patient has been without fever,     her white count was normal, and she has improved clinically.  With     regard to her exacerbation of COPD, the patient was started on IV     Solu-Medrol, then transitioned over to prednisone.  The patient     chronically uses prednisone at 5 mg p.o. b.i.d., she is presently     on a slow prednisone taper and then to resume her usual dosing of     prednisone pertinent to her kidney transplant. 2. Atrial fibrillation with rapid ventricular response.  The patient     was found to be in AFib with RVR, on admission.  She was placed on     a Cardizem drip and then transitioned over to her oral medications     which included both Cardizem and metoprolol.  The patient's heart     rate has been well controlled with Cardizem, metoprolol, and her     clonidine and she will continue her usual dosing.  It was felt that     the patient went into rapid ventricular rate due to her acute     illness which has now been fully treated and  resolved. 3. C difficile colitis.  The patient developed diarrhea while in the     hospital and had been at risk due to the fact that she had been     hospitalized before, had been on recent antibiotics, and was now on     antibiotics again including Zosyn.  Stool tested positive for C     difficile PCR.  The patient was started on p.o. Flagyl.  Initially,     her white blood cell  count was elevated, white blood cell count is     now down to normal and her diarrhea has been slowing down where she     is able to keep up with it through oral intake.  I have spoken with     the nephrologist, Dr. Allena Katz, who is in agreement that the patient     can certainly be discharged safely on the Flagyl.  She is to follow     up in the office on Tuesday for labs and at that time they can     reevaluate the need for any further addition of therapy.  If the     patient continues to have diarrhea or if the volume of diarrhea     increases, I would consider putting her on oral vancomycin at that     time.  The patient presently is being sent home with a prescription     for Flagyl for a total of 10 days of 500 mg p.o. q.8 h. 4. Acute-on-chronic renal failure.  The patient was noted to have     chronic kidney disease stage 4 transplant.  She had an increase in     her creatinine.  Dr. Allena Katz saw the patient in consultation and     presently her creatinine is on the downturn and has progressively     been going down over the last 3 days.  I have spoken with Dr. Allena Katz     regarding this, he feels that from his standpoint the patient is     safe to be discharged and is to follow up with Dr. Caryn Section in the     office on Tuesday for laboratory studies to be done then. 5. Anemia.  The patient was found to be anemic.  She received a     transfusion of 1 unit of packed red blood cells and her hemoglobin     was stable.  This was likely due to her chronic diseases.  Dr. Caryn Section     will follow the hemoglobin in the clinic. 6.  Hypertension.  The patient does have hypertension and had some     episodes of uncontrolled hypertension here in the hospital.  This     was mainly due to the fact that the patient was not on her full     dose of the medications.  When she was resumed her blood pressures     were well controlled and she has had no further difficulty with it. 7. Systemic lupus erythematosus was status post cadaveric transplant.     The patient is under the care of Nephrology for this and she is on     chronic prednisone which she will continue after her taper. 8. Chronic edema in the right lower extremity.  The patient has     longstanding edema of her right lower extremity which has been     evaluated multiple times in the past.  The patient generally uses     compression hose for the lower extremity, however, during this     hospitalization she chose not to wear the hose and had some     intermittent swelling depending upon the positioning of her lower     extremities.  She is encouraged to resume use of her compression     hose and elevate her legs when in the supine position. 9. Hypokalemia.  The patient was found to be hypokalemic and this was     repleted  both IV and orally.  At the time of discharge, the     patient's potassium was 4.9 within normal ranges.  At the time of     discharge, the patient was stable.  PHYSICAL EXAMINATION:  GENERAL:  The patient is well appearing.  She is sitting at the side of bed, her husband is in the room, and she has no complaints at this time. VITAL SIGNS:  Temperature is 97.5, heart rate 65, blood pressure 158/92, respiratory rate 18, O2 saturations are 98% on room air. LUNGS:  Clear to auscultation.  No wheezing, rhonchi, or rales noted. CARDIOVASCULAR:  She is currently in sinus rhythm; however, has periods of going into atrial fibrillation, however, her heart rate is controlled.  She has got no murmurs, rubs, or gallops noted. ABDOMEN:  Obese, soft, nontender,  nondistended.  No masses, no hepatosplenomegaly is noted. EXTREMITIES:  Right lower extremity edema which is at her baseline. NEUROLOGIC:  She has no focal neurological deficits noted.  Cranial nerves II through XII are grossly intact. PSYCHIATRIC:  She is alert and oriented x3.  Good insight and cognition. Good recent and remote recall.  DIETARY RESTRICTIONS:  The patient should be on a no added salt diet.  PHYSICAL RESTRICTIONS:  Activity as tolerated.  FOLLOWUP:  The patient is to follow up for renal panel labs to be done in Dr. Scherrie Gerlach office on Tuesday, September 15, 2010.  At that time, they will also check her hemoglobin and will evaluate whether or not her C difficile is improving.  Total time for this discharge process including face-to-face time 57 minutes.     Altha Harm, MD     MAM/MEDQ  D:  09/13/2010  T:  09/14/2010  Job:  [id number]  cc:   Dr. Overton Mam B. Sherene Sires, MD, FCCP [address] [place] [address]  Wilber Bihari. Caryn Section, M.D. Fax: [phone]  Electronically Signed by Marthann Schiller MD on 09/24/2010 07:56:49 AM

## 2010-09-25 ENCOUNTER — Telehealth: Payer: Self-pay | Admitting: Pulmonary Disease

## 2010-09-25 MED ORDER — BENZONATATE 200 MG PO CAPS: 200.0000 mg | ORAL_CAPSULE | Freq: Three times a day (TID) | ORAL | Status: AC | PRN

## 2010-09-25 NOTE — Telephone Encounter (Signed)
Spoke w/ pt and she states she was to call back and advise Dr. Vassie Loll on how she was doing on symbicort. Pt states she does not like it. Pt states she is not taking it b/c she doesn't want to risk rhe chance of infection and as well as she states she has to many medical problems. Pt states Dr. Vassie Loll advised her that if she did not like the symbicort then he would start her on a nebulizer. Pt states she feels all right now and wants to hold off on getting the neb medicines. Pt also states Dr. Vassie Loll was going to give her tessalon pearls but she advised him she would get this filled by her pcp. They advised her Dr. Vassie Loll needs to fill this rx for her. Pt advised Dr. Vassie Loll is out of the office until Monday. Pt states she was okay w/ waiting until then. Pt requests to speak to Shanda Bumps R I advised her she is w/ pt's and she states she still wanted to talk to [place]. Will forward to Jess R so she is aware as well as to Dr. Vassie Loll please advise. Thanks  Carver Fila, CMA

## 2010-09-25 NOTE — Telephone Encounter (Signed)
Spoke with pt and notified rx for tessalon was refilled. Pt verbalized understanding.

## 2010-09-25 NOTE — Telephone Encounter (Signed)
Ok to give Rx for tesalon/ benzonatate 200 tid prn x 30 days with 1 refill

## 2010-09-28 ENCOUNTER — Ambulatory Visit: Payer: BC Managed Care – PPO | Admitting: Pulmonary Disease

## 2010-09-29 ENCOUNTER — Institutional Professional Consult (permissible substitution): Payer: BC Managed Care – PPO | Admitting: Emergency Medicine

## 2010-10-01 ENCOUNTER — Encounter: Payer: Self-pay | Admitting: Internal Medicine

## 2010-10-06 ENCOUNTER — Ambulatory Visit
Admission: RE | Admit: 2010-10-06 | Discharge: 2010-10-06 | Disposition: A | Discharge status: Home / Self Care | Payer: BC Managed Care – PPO | Source: Ambulatory Visit | Attending: Nephrology | Admitting: Nephrology

## 2010-10-06 ENCOUNTER — Other Ambulatory Visit: Payer: Self-pay | Admitting: Nephrology

## 2010-10-06 DIAGNOSIS — Z87440 Personal history of urinary (tract) infections: Secondary | ICD-10-CM

## 2010-10-07 ENCOUNTER — Inpatient Hospital Stay (HOSPITAL_COMMUNITY)
Admission: EM | Admit: 2010-10-07 | Discharge: 2010-10-23 | DRG: 581 | Disposition: A | Discharge status: Other Acute Inpatient Hospital | Payer: BC Managed Care – PPO | Attending: Internal Medicine | Admitting: Internal Medicine

## 2010-10-07 ENCOUNTER — Emergency Department (HOSPITAL_COMMUNITY): Payer: BC Managed Care – PPO

## 2010-10-07 DIAGNOSIS — Z940 Kidney transplant status: Secondary | ICD-10-CM

## 2010-10-07 DIAGNOSIS — K661 Hemoperitoneum: Secondary | ICD-10-CM | POA: Diagnosis not present

## 2010-10-07 DIAGNOSIS — I824Z9 Acute embolism and thrombosis of unspecified deep veins of unspecified distal lower extremity: Secondary | ICD-10-CM | POA: Diagnosis present

## 2010-10-07 DIAGNOSIS — I129 Hypertensive chronic kidney disease with stage 1 through stage 4 chronic kidney disease, or unspecified chronic kidney disease: Secondary | ICD-10-CM | POA: Diagnosis present

## 2010-10-07 DIAGNOSIS — R609 Edema, unspecified: Secondary | ICD-10-CM | POA: Diagnosis present

## 2010-10-07 DIAGNOSIS — J9600 Acute respiratory failure, unspecified whether with hypoxia or hypercapnia: Secondary | ICD-10-CM | POA: Diagnosis not present

## 2010-10-07 DIAGNOSIS — J151 Pneumonia due to Pseudomonas: Secondary | ICD-10-CM | POA: Diagnosis present

## 2010-10-07 DIAGNOSIS — E46 Unspecified protein-calorie malnutrition: Secondary | ICD-10-CM | POA: Diagnosis not present

## 2010-10-07 DIAGNOSIS — I4892 Unspecified atrial flutter: Secondary | ICD-10-CM | POA: Diagnosis present

## 2010-10-07 DIAGNOSIS — J9819 Other pulmonary collapse: Secondary | ICD-10-CM | POA: Diagnosis present

## 2010-10-07 DIAGNOSIS — N390 Urinary tract infection, site not specified: Secondary | ICD-10-CM | POA: Diagnosis present

## 2010-10-07 DIAGNOSIS — IMO0002 Reserved for concepts with insufficient information to code with codable children: Secondary | ICD-10-CM

## 2010-10-07 DIAGNOSIS — I120 Hypertensive chronic kidney disease with stage 5 chronic kidney disease or end stage renal disease: Secondary | ICD-10-CM

## 2010-10-07 DIAGNOSIS — E872 Acidosis, unspecified: Secondary | ICD-10-CM | POA: Diagnosis present

## 2010-10-07 DIAGNOSIS — E8809 Other disorders of plasma-protein metabolism, not elsewhere classified: Secondary | ICD-10-CM | POA: Diagnosis not present

## 2010-10-07 DIAGNOSIS — E871 Hypo-osmolality and hyponatremia: Secondary | ICD-10-CM | POA: Diagnosis not present

## 2010-10-07 DIAGNOSIS — I509 Heart failure, unspecified: Secondary | ICD-10-CM | POA: Diagnosis present

## 2010-10-07 DIAGNOSIS — A0472 Enterocolitis due to Clostridium difficile, not specified as recurrent: Secondary | ICD-10-CM | POA: Diagnosis present

## 2010-10-07 DIAGNOSIS — L02419 Cutaneous abscess of limb, unspecified: Secondary | ICD-10-CM | POA: Diagnosis present

## 2010-10-07 DIAGNOSIS — N186 End stage renal disease: Secondary | ICD-10-CM

## 2010-10-07 DIAGNOSIS — E876 Hypokalemia: Secondary | ICD-10-CM | POA: Diagnosis not present

## 2010-10-07 DIAGNOSIS — M329 Systemic lupus erythematosus, unspecified: Secondary | ICD-10-CM | POA: Diagnosis present

## 2010-10-07 DIAGNOSIS — D62 Acute posthemorrhagic anemia: Secondary | ICD-10-CM | POA: Diagnosis not present

## 2010-10-07 DIAGNOSIS — I5032 Chronic diastolic (congestive) heart failure: Secondary | ICD-10-CM | POA: Diagnosis present

## 2010-10-07 DIAGNOSIS — I4891 Unspecified atrial fibrillation: Secondary | ICD-10-CM | POA: Diagnosis present

## 2010-10-07 DIAGNOSIS — Z4789 Encounter for other orthopedic aftercare: Secondary | ICD-10-CM

## 2010-10-07 DIAGNOSIS — N2581 Secondary hyperparathyroidism of renal origin: Secondary | ICD-10-CM | POA: Diagnosis present

## 2010-10-07 DIAGNOSIS — M629 Disorder of muscle, unspecified: Secondary | ICD-10-CM

## 2010-10-07 DIAGNOSIS — D72829 Elevated white blood cell count, unspecified: Secondary | ICD-10-CM | POA: Diagnosis present

## 2010-10-07 DIAGNOSIS — G9341 Metabolic encephalopathy: Secondary | ICD-10-CM | POA: Diagnosis present

## 2010-10-07 DIAGNOSIS — A419 Sepsis, unspecified organism: Principal | ICD-10-CM | POA: Diagnosis present

## 2010-10-07 DIAGNOSIS — I951 Orthostatic hypotension: Secondary | ICD-10-CM | POA: Diagnosis not present

## 2010-10-07 DIAGNOSIS — N184 Chronic kidney disease, stage 4 (severe): Secondary | ICD-10-CM | POA: Diagnosis not present

## 2010-10-07 DIAGNOSIS — N170 Acute kidney failure with tubular necrosis: Secondary | ICD-10-CM | POA: Diagnosis present

## 2010-10-07 LAB — GLUCOSE, CAPILLARY: Glucose-Capillary: 38 mg/dL — CL (ref 70–99)

## 2010-10-07 LAB — URINALYSIS, ROUTINE W REFLEX MICROSCOPIC
Bilirubin Urine: NEGATIVE
Nitrite: NEGATIVE
Specific Gravity, Urine: 1.02 (ref 1.005–1.030)
Urobilinogen, UA: 0.2 mg/dL (ref 0.0–1.0)
pH: 5.5 (ref 5.0–8.0)

## 2010-10-07 LAB — URINE MICROSCOPIC-ADD ON

## 2010-10-07 LAB — CBC
Hemoglobin: 12.3 g/dL (ref 12.0–15.0)
Platelets: 297 10*3/uL (ref 150–400)
RBC: 4.16 MIL/uL (ref 3.87–5.11)
WBC: 28.6 10*3/uL — ABNORMAL HIGH (ref 4.0–10.5)

## 2010-10-07 LAB — DIFFERENTIAL
Basophils Absolute: 0 10*3/uL (ref 0.0–0.1)
Basophils Relative: 0 % (ref 0–1)
Eosinophils Absolute: 0 10*3/uL (ref 0.0–0.7)
Lymphocytes Relative: 4 % — ABNORMAL LOW (ref 12–46)
Monocytes Absolute: 1.7 10*3/uL — ABNORMAL HIGH (ref 0.1–1.0)
Neutrophils Relative %: 90 % — ABNORMAL HIGH (ref 43–77)

## 2010-10-07 LAB — PROCALCITONIN: Procalcitonin: 45.1 ng/mL

## 2010-10-08 ENCOUNTER — Encounter: Payer: Self-pay | Admitting: Pulmonary Disease

## 2010-10-08 ENCOUNTER — Inpatient Hospital Stay (HOSPITAL_COMMUNITY): Payer: BC Managed Care – PPO

## 2010-10-08 DIAGNOSIS — M79609 Pain in unspecified limb: Secondary | ICD-10-CM

## 2010-10-08 DIAGNOSIS — A419 Sepsis, unspecified organism: Secondary | ICD-10-CM

## 2010-10-08 DIAGNOSIS — I824Y9 Acute embolism and thrombosis of unspecified deep veins of unspecified proximal lower extremity: Secondary | ICD-10-CM

## 2010-10-08 DIAGNOSIS — N19 Unspecified kidney failure: Secondary | ICD-10-CM

## 2010-10-08 DIAGNOSIS — N179 Acute kidney failure, unspecified: Secondary | ICD-10-CM

## 2010-10-08 LAB — POCT I-STAT 3, ART BLOOD GAS (G3+)
Acid-base deficit: 19 mmol/L — ABNORMAL HIGH (ref 0.0–2.0)
Bicarbonate: 7.7 mEq/L — ABNORMAL LOW (ref 20.0–24.0)
O2 Saturation: 94 %
Patient temperature: 98.6
Patient temperature: 97.6
Patient temperature: 97
TCO2: 8 mmol/L (ref 0–100)
TCO2: 11 mmol/L (ref 0–100)
pCO2 arterial: 20.4 mmHg — ABNORMAL LOW (ref 35.0–45.0)
pH, Arterial: 7.127 — CL (ref 7.350–7.400)
pH, Arterial: 7.284 — ABNORMAL LOW (ref 7.350–7.400)
pO2, Arterial: 83 mmHg (ref 80.0–100.0)

## 2010-10-08 LAB — DIFFERENTIAL
Basophils Relative: 0 % (ref 0–1)
Eosinophils Absolute: 0 10*3/uL (ref 0.0–0.7)
Eosinophils Relative: 0 % (ref 0–5)
Monocytes Absolute: 1 10*3/uL (ref 0.1–1.0)
Neutro Abs: 22.2 10*3/uL — ABNORMAL HIGH (ref 1.7–7.7)

## 2010-10-08 LAB — CBC
Hemoglobin: 8.7 g/dL — ABNORMAL LOW (ref 12.0–15.0)
MCH: 28.6 pg (ref 26.0–34.0)
MCHC: 31.1 g/dL (ref 30.0–36.0)
MCV: 91.9 fL (ref 78.0–100.0)
Platelets: 284 10*3/uL (ref 150–400)
RBC: 2.94 MIL/uL — ABNORMAL LOW (ref 3.87–5.11)

## 2010-10-08 LAB — GLUCOSE, CAPILLARY
Glucose-Capillary: 119 mg/dL — ABNORMAL HIGH (ref 70–99)
Glucose-Capillary: 143 mg/dL — ABNORMAL HIGH (ref 70–99)
Glucose-Capillary: 165 mg/dL — ABNORMAL HIGH (ref 70–99)
Glucose-Capillary: 184 mg/dL — ABNORMAL HIGH (ref 70–99)
Glucose-Capillary: 21 mg/dL — CL (ref 70–99)
Glucose-Capillary: 213 mg/dL — ABNORMAL HIGH (ref 70–99)
Glucose-Capillary: 51 mg/dL — ABNORMAL LOW (ref 70–99)
Glucose-Capillary: 81 mg/dL (ref 70–99)
Glucose-Capillary: 87 mg/dL (ref 70–99)

## 2010-10-08 LAB — URINE MICROSCOPIC-ADD ON

## 2010-10-08 LAB — BASIC METABOLIC PANEL
BUN: 107 mg/dL — ABNORMAL HIGH (ref 6–23)
BUN: 98 mg/dL — ABNORMAL HIGH (ref 6–23)
BUN: 97 mg/dL — ABNORMAL HIGH (ref 6–23)
CO2: <10 mEq/L — CL (ref 19–32)
CO2: 8 mEq/L — CL (ref 19–32)
Chloride: 91 mEq/L — ABNORMAL LOW (ref 96–112)
Chloride: 100 mEq/L (ref 96–112)
Creatinine, Ser: 4.99 mg/dL — ABNORMAL HIGH (ref 0.4–1.2)
GFR calc non Af Amer: 8 mL/min — ABNORMAL LOW (ref >60)
GFR calc non Af Amer: 8 mL/min — ABNORMAL LOW (ref >60)
GFR calc non Af Amer: 9 mL/min — ABNORMAL LOW (ref >60)
Glucose, Bld: 207 mg/dL — ABNORMAL HIGH (ref 70–99)
Glucose, Bld: 147 mg/dL — ABNORMAL HIGH (ref 70–99)
Glucose, Bld: 87 mg/dL (ref 70–99)
Potassium: 4.2 mEq/L (ref 3.5–5.1)
Potassium: 4 mEq/L (ref 3.5–5.1)
Potassium: 3.7 mEq/L (ref 3.5–5.1)
Sodium: 125 mEq/L — ABNORMAL LOW (ref 135–145)
Sodium: 127 mEq/L — ABNORMAL LOW (ref 135–145)
Sodium: 132 mEq/L — ABNORMAL LOW (ref 135–145)

## 2010-10-08 LAB — HEPARIN LEVEL (UNFRACTIONATED)
Heparin Unfractionated: <0.1 IU/mL — ABNORMAL LOW (ref 0.30–0.70)
Heparin Unfractionated: 0.7 IU/mL (ref 0.30–0.70)

## 2010-10-08 LAB — CARDIAC PANEL(CRET KIN+CKTOT+MB+TROPI)
CK, MB: 2.7 ng/mL (ref 0.3–4.0)
CK, MB: 2.9 ng/mL (ref 0.3–4.0)
Relative Index: INVALID (ref 0.0–2.5)
Total CK: 16 U/L (ref 7–177)
Total CK: 14 U/L (ref 7–177)
Troponin I: <0.3 ng/mL (ref <0.30)

## 2010-10-08 LAB — HEPATIC FUNCTION PANEL
ALT: 8 U/L (ref 0–35)
AST: 10 U/L (ref 0–37)
Albumin: 1.9 g/dL — ABNORMAL LOW (ref 3.5–5.2)
Bilirubin, Direct: 0.1 mg/dL (ref 0.0–0.3)
Total Bilirubin: 0.1 mg/dL — ABNORMAL LOW (ref 0.3–1.2)

## 2010-10-08 LAB — CLOSTRIDIUM DIFFICILE BY PCR

## 2010-10-08 LAB — LACTIC ACID, PLASMA: Lactic Acid, Venous: 1 mmol/L (ref 0.5–2.2)

## 2010-10-08 LAB — UREA NITROGEN, URINE: Urea Nitrogen, Ur: 306 mg/dL

## 2010-10-08 LAB — URINALYSIS, ROUTINE W REFLEX MICROSCOPIC
Glucose, UA: NEGATIVE mg/dL
Ketones, ur: NEGATIVE mg/dL
Protein, ur: 100 mg/dL — AB
Urobilinogen, UA: 0.2 mg/dL (ref 0.0–1.0)

## 2010-10-08 LAB — CREATININE, URINE, RANDOM: Creatinine, Urine: 67.93 mg/dL

## 2010-10-08 LAB — RENAL FUNCTION PANEL
CO2: 16 mEq/L — ABNORMAL LOW (ref 19–32)
Calcium: 7.4 mg/dL — ABNORMAL LOW (ref 8.4–10.5)
Chloride: 93 mEq/L — ABNORMAL LOW (ref 96–112)
Glucose, Bld: 215 mg/dL — ABNORMAL HIGH (ref 70–99)
Sodium: 127 mEq/L — ABNORMAL LOW (ref 135–145)

## 2010-10-08 LAB — SODIUM, URINE, RANDOM: Sodium, Ur: 34 mEq/L

## 2010-10-08 LAB — PROCALCITONIN: Procalcitonin: 33.7 ng/mL

## 2010-10-08 LAB — MRSA PCR SCREENING: MRSA by PCR: NEGATIVE

## 2010-10-08 LAB — PHOSPHORUS: Phosphorus: 10.7 mg/dL — ABNORMAL HIGH (ref 2.3–4.6)

## 2010-10-09 ENCOUNTER — Inpatient Hospital Stay (HOSPITAL_COMMUNITY): Payer: BC Managed Care – PPO

## 2010-10-09 DIAGNOSIS — M79609 Pain in unspecified limb: Secondary | ICD-10-CM

## 2010-10-09 DIAGNOSIS — M7989 Other specified soft tissue disorders: Secondary | ICD-10-CM

## 2010-10-09 DIAGNOSIS — I4892 Unspecified atrial flutter: Secondary | ICD-10-CM

## 2010-10-09 DIAGNOSIS — I059 Rheumatic mitral valve disease, unspecified: Secondary | ICD-10-CM

## 2010-10-09 DIAGNOSIS — N19 Unspecified kidney failure: Secondary | ICD-10-CM

## 2010-10-09 LAB — RENAL FUNCTION PANEL
CO2: 23 mEq/L (ref 19–32)
CO2: 28 mEq/L (ref 19–32)
Calcium: 7.1 mg/dL — ABNORMAL LOW (ref 8.4–10.5)
Chloride: 89 mEq/L — ABNORMAL LOW (ref 96–112)
GFR calc Af Amer: 12 mL/min — ABNORMAL LOW (ref >60)
GFR calc Af Amer: 14 mL/min — ABNORMAL LOW (ref >60)
GFR calc non Af Amer: 10 mL/min — ABNORMAL LOW (ref >60)
GFR calc non Af Amer: 12 mL/min — ABNORMAL LOW (ref >60)
Glucose, Bld: 178 mg/dL — ABNORMAL HIGH (ref 70–99)
Phosphorus: 6.9 mg/dL — ABNORMAL HIGH (ref 2.3–4.6)
Potassium: 2.9 mEq/L — ABNORMAL LOW (ref 3.5–5.1)
Potassium: 3.1 mEq/L — ABNORMAL LOW (ref 3.5–5.1)
Sodium: 131 mEq/L — ABNORMAL LOW (ref 135–145)
Sodium: 132 mEq/L — ABNORMAL LOW (ref 135–145)

## 2010-10-09 LAB — POCT I-STAT, CHEM 8
Chloride: 113 mEq/L — ABNORMAL HIGH (ref 96–112)
Creatinine, Ser: 6.1 mg/dL — ABNORMAL HIGH (ref 0.4–1.2)
HCT: 42 % (ref 36.0–46.0)
Hemoglobin: 14.3 g/dL (ref 12.0–15.0)
Potassium: 5.3 mEq/L — ABNORMAL HIGH (ref 3.5–5.1)
Sodium: 130 mEq/L — ABNORMAL LOW (ref 135–145)

## 2010-10-09 LAB — CBC
HCT: 23.7 % — ABNORMAL LOW (ref 36.0–46.0)
Hemoglobin: 8.1 g/dL — ABNORMAL LOW (ref 12.0–15.0)
MCV: 86.8 fL (ref 78.0–100.0)
RBC: 2.73 MIL/uL — ABNORMAL LOW (ref 3.87–5.11)
WBC: 17.8 10*3/uL — ABNORMAL HIGH (ref 4.0–10.5)

## 2010-10-09 LAB — GLUCOSE, CAPILLARY
Glucose-Capillary: 173 mg/dL — ABNORMAL HIGH (ref 70–99)
Glucose-Capillary: 152 mg/dL — ABNORMAL HIGH (ref 70–99)

## 2010-10-09 LAB — POCT I-STAT 3, ART BLOOD GAS (G3+)
TCO2: 29 mmol/L (ref 0–100)
pCO2 arterial: 36.7 mmHg (ref 35.0–45.0)
pH, Arterial: 7.492 — ABNORMAL HIGH (ref 7.350–7.400)
pO2, Arterial: 60 mmHg — ABNORMAL LOW (ref 80.0–100.0)

## 2010-10-09 LAB — URINE CULTURE: Colony Count: >=100000

## 2010-10-10 ENCOUNTER — Inpatient Hospital Stay (HOSPITAL_COMMUNITY): Payer: BC Managed Care – PPO

## 2010-10-10 DIAGNOSIS — N19 Unspecified kidney failure: Secondary | ICD-10-CM

## 2010-10-10 DIAGNOSIS — M79609 Pain in unspecified limb: Secondary | ICD-10-CM

## 2010-10-10 DIAGNOSIS — A419 Sepsis, unspecified organism: Secondary | ICD-10-CM

## 2010-10-10 DIAGNOSIS — E782 Mixed hyperlipidemia: Secondary | ICD-10-CM

## 2010-10-10 DIAGNOSIS — R578 Other shock: Secondary | ICD-10-CM

## 2010-10-10 LAB — CBC
HCT: 25.4 % — ABNORMAL LOW (ref 36.0–46.0)
Hemoglobin: 8.1 g/dL — ABNORMAL LOW (ref 12.0–15.0)
MCV: 88.8 fL (ref 78.0–100.0)
Platelets: 267 10*3/uL (ref 150–400)
RBC: 2.86 MIL/uL — ABNORMAL LOW (ref 3.87–5.11)
WBC: 18.9 10*3/uL — ABNORMAL HIGH (ref 4.0–10.5)

## 2010-10-10 LAB — CULTURE, BLOOD (ROUTINE X 2): Culture  Setup Time: 201205030614

## 2010-10-10 LAB — RENAL FUNCTION PANEL
CO2: 25 mEq/L (ref 19–32)
Calcium: 7.2 mg/dL — ABNORMAL LOW (ref 8.4–10.5)
Chloride: 86 mEq/L — ABNORMAL LOW (ref 96–112)
GFR calc Af Amer: 15 mL/min — ABNORMAL LOW (ref >60)
GFR calc non Af Amer: 13 mL/min — ABNORMAL LOW (ref >60)
Potassium: 4.1 mEq/L (ref 3.5–5.1)
Sodium: 128 mEq/L — ABNORMAL LOW (ref 135–145)

## 2010-10-10 LAB — GLUCOSE, CAPILLARY
Glucose-Capillary: 129 mg/dL — ABNORMAL HIGH (ref 70–99)
Glucose-Capillary: 132 mg/dL — ABNORMAL HIGH (ref 70–99)

## 2010-10-10 LAB — BASIC METABOLIC PANEL
CO2: 23 mEq/L (ref 19–32)
Calcium: 7.5 mg/dL — ABNORMAL LOW (ref 8.4–10.5)
Creatinine, Ser: 4.09 mg/dL — ABNORMAL HIGH (ref 0.4–1.2)
GFR calc Af Amer: 14 mL/min — ABNORMAL LOW (ref >60)

## 2010-10-10 LAB — DIFFERENTIAL
Lymphocytes Relative: 1 % — ABNORMAL LOW (ref 12–46)
Lymphs Abs: 0.3 10*3/uL — ABNORMAL LOW (ref 0.7–4.0)
Neutrophils Relative %: 94 % — ABNORMAL HIGH (ref 43–77)

## 2010-10-11 ENCOUNTER — Inpatient Hospital Stay (HOSPITAL_COMMUNITY): Payer: BC Managed Care – PPO

## 2010-10-11 DIAGNOSIS — I4891 Unspecified atrial fibrillation: Secondary | ICD-10-CM

## 2010-10-11 LAB — RENAL FUNCTION PANEL
Albumin: 1.8 g/dL — ABNORMAL LOW (ref 3.5–5.2)
CO2: 25 mEq/L (ref 19–32)
Calcium: 7.7 mg/dL — ABNORMAL LOW (ref 8.4–10.5)
Chloride: 89 mEq/L — ABNORMAL LOW (ref 96–112)
Creatinine, Ser: 4.05 mg/dL — ABNORMAL HIGH (ref 0.4–1.2)
GFR calc Af Amer: 14 mL/min — ABNORMAL LOW (ref >60)
GFR calc non Af Amer: 12 mL/min — ABNORMAL LOW (ref >60)
Sodium: 128 mEq/L — ABNORMAL LOW (ref 135–145)

## 2010-10-11 LAB — CARDIAC PANEL(CRET KIN+CKTOT+MB+TROPI)
CK, MB: 1.9 ng/mL (ref 0.3–4.0)
CK, MB: 2.3 ng/mL (ref 0.3–4.0)
Relative Index: INVALID (ref 0.0–2.5)
Total CK: 26 U/L (ref 7–177)
Troponin I: <0.3 ng/mL (ref <0.30)

## 2010-10-11 LAB — CBC
HCT: 24.5 % — ABNORMAL LOW (ref 36.0–46.0)
Hemoglobin: 8 g/dL — ABNORMAL LOW (ref 12.0–15.0)
MCHC: 32.7 g/dL (ref 30.0–36.0)
RDW: 17.8 % — ABNORMAL HIGH (ref 11.5–15.5)
WBC: 21.7 10*3/uL — ABNORMAL HIGH (ref 4.0–10.5)

## 2010-10-11 LAB — HEPARIN LEVEL (UNFRACTIONATED): Heparin Unfractionated: 0.42 IU/mL (ref 0.30–0.70)

## 2010-10-11 LAB — MAGNESIUM: Magnesium: 1.7 mg/dL (ref 1.5–2.5)

## 2010-10-11 LAB — GLUCOSE, CAPILLARY
Glucose-Capillary: 125 mg/dL — ABNORMAL HIGH (ref 70–99)
Glucose-Capillary: 118 mg/dL — ABNORMAL HIGH (ref 70–99)
Glucose-Capillary: 130 mg/dL — ABNORMAL HIGH (ref 70–99)

## 2010-10-12 ENCOUNTER — Ambulatory Visit: Payer: BC Managed Care – PPO | Admitting: Pulmonary Disease

## 2010-10-12 DIAGNOSIS — N19 Unspecified kidney failure: Secondary | ICD-10-CM

## 2010-10-12 DIAGNOSIS — M79609 Pain in unspecified limb: Secondary | ICD-10-CM

## 2010-10-12 LAB — RENAL FUNCTION PANEL
CO2: 23 mEq/L (ref 19–32)
Calcium: 8.1 mg/dL — ABNORMAL LOW (ref 8.4–10.5)
GFR calc Af Amer: 16 mL/min — ABNORMAL LOW (ref >60)
GFR calc non Af Amer: 13 mL/min — ABNORMAL LOW (ref >60)
Potassium: 3.2 mEq/L — ABNORMAL LOW (ref 3.5–5.1)
Sodium: 128 mEq/L — ABNORMAL LOW (ref 135–145)

## 2010-10-12 LAB — GLUCOSE, CAPILLARY
Glucose-Capillary: 110 mg/dL — ABNORMAL HIGH (ref 70–99)
Glucose-Capillary: 129 mg/dL — ABNORMAL HIGH (ref 70–99)
Glucose-Capillary: 161 mg/dL — ABNORMAL HIGH (ref 70–99)

## 2010-10-12 LAB — IRON AND TIBC: UIBC: <55 ug/dL

## 2010-10-12 LAB — CARDIAC PANEL(CRET KIN+CKTOT+MB+TROPI): Relative Index: INVALID (ref 0.0–2.5)

## 2010-10-12 LAB — CBC
HCT: 24.9 % — ABNORMAL LOW (ref 36.0–46.0)
MCH: 29.1 pg (ref 26.0–34.0)
MCHC: 32.5 g/dL (ref 30.0–36.0)
MCV: 89.6 fL (ref 78.0–100.0)
RDW: 17.3 % — ABNORMAL HIGH (ref 11.5–15.5)
WBC: 26.5 10*3/uL — ABNORMAL HIGH (ref 4.0–10.5)

## 2010-10-12 LAB — MAGNESIUM: Magnesium: 1.6 mg/dL (ref 1.5–2.5)

## 2010-10-12 NOTE — Consult Note (Signed)
[name], [name] NO.:  [id number]  MEDICAL RECORD NO.:  [id number]           PATIENT TYPE:  I  LOCATION:  2104                         FACILITY:  [facility]  PHYSICIAN:  Cynthia Hora. Janette Harvie, MD  DATE OF BIRTH:  17-Jul-1960  DATE OF CONSULTATION:  10/07/2010 DATE OF DISCHARGE:                                CONSULTATION   REQUESTING PHYSICIAN:  [facility].  CHIEF COMPLAINT:  Leg and foot pain.  HISTORY OF PRESENT ILLNESS:  The patient is a 50 year old female who is being admitted today by the Medical Service for urinary tract infection and pneumonia.  She was noticed on exam to have a cool right lower extremity.  I am asked to evaluate her for possible acute ischemia.  The patient has multiple medical problems which are chronic in nature but she has had several acute exacerbations recently.  She has a history of lupus, congestive heart failure, COPD, atrial fibrillation, hypertension.  The patient has had two prior renal transplants, the first one failed, the second wound has been marginal with baseline creatinine in the 2 range.  The patient had been able to walk previously, but has progressively become weaker over the last week.  The patient's husband states that she has been unable to bear weight on her leg for the last 3 days.  She has been confused for the last 36 hours. She is also a very poor historian secondary to this.  The patient does have a known history of atrial fibrillation.  She was on Coumadin in the past, but developed a severe hematoma in her left leg which required skin grafting and she has not been restarted on Coumadin since then. The patient denies any prior embolic events.  The patient's husband denies any prior embolic events and no prior stroke.  The patient has had some shortness of breath over the last week, which the husband states is from her pneumonia.  She also had a recent urinary tract infection, which was noted by Dr.  Caryn Section on a recent office visit and has also had some decline in her renal function with elevated creatinine. The patient is unable to tell me any specific time of onset of pain in the right leg.  She does have some pain in the right foot.  However, on questioning, she also states that she has pain in the left foot and her story does not really match questioning and she is significantly confused.  PAST SURGICAL HISTORY: 1. Colon resection for diverticulitis. 2. Kidney transplant. 3. Previous skin graft in left leg.  PAST MEDICAL HISTORY:  As listed above.  REVIEW OF SYSTEMS:  Unable to obtain from the patient.  FAMILY HISTORY:  Positive for autoimmune disorder.  SOCIAL HISTORY:  She denies tobacco or alcohol use.  MEDICATIONS:  Cefepime, Solu-Medrol, metoprolol, mycophenolate, Avelox and vancomycin.  ALLERGIES:  COMPAZINE and SULFA.  PHYSICAL EXAMINATION:  VITAL SIGNS:  Blood pressure is 79/48, heart rate is 69, temperature is 98.6, respirations 20. GENERAL:  She is a white female.  She is very ill appearing and pale. She is alert and oriented x2 to person  and place only, not to time or situation. HEENT:  Unremarkable. NECK:  Has 2+ carotid pulses.  She has right-sided central line. CHEST:  Has shallow respirations with decreased breath sounds in the bases. CARDIAC:  Irregularly irregular without murmur. ABDOMEN:  Soft, mild right upper quadrant tenderness and left lower quadrant tenderness over her allograft. EXTREMITIES:  She has no palpable radial or pedal pulses bilaterally.  I am able to obtain Doppler signals at the brachial level bilaterally. She does have 2+ femoral pulses.  She has a biphasic dorsalis pedis Doppler signal in the right foot.  She has a monophasic posterior tibial Doppler signal in the right foot.  She has a monophasic peroneal Doppler signal in the left foot.  The right leg has a bluish hue from the knee down to the foot.  The patient's husband states  that the leg has been like that for some time, but the blue discoloration may be slightly worse.  Both feet are cool with the right foot possibly being slightly more cool than left.  Hands are also fairly cool bilaterally.  The right leg is asymmetric to the left.  The right leg is approximately 75% of larger circumference than the left leg.  There is some edema throughout the right leg.  This is pitting in the foot and in the calf. NEUROLOGIC:  She is able to wiggle the toes in the right foot.  She is unable to flex or extend the ankle on the right foot.  She is able to flex and extend the ankle on the left foot and wiggle toes on that side. She is diffusely weak with 3-4/5 motor strength throughout with her lower extremity is being weaker than her upper.  LABORATORY REVIEW:  Urinalysis shows too numerous to count white blood cells with large blood, large protein, and positive bacteria.  White blood cell counts 28.6, hemoglobin 12, platelet count 297. Electrolytes; sodium was 130, potassium 5.3, creatinine 6.1, BUN 118, glucose 61.  EKG shows atrial fibrillation with a heart rate of 92.  ASSESSMENT:  A 50 year old female with multiple medical problems, critically ill with cool lower and upper extremities with possibly the right lower extremity being the worst, all of these have marginal perfusion.  I believe that this most likely represents a systemic sepsis with malperfusion of the extremities from her urinary tract infection and pneumonia.  She also currently has worsening renal failure.  She most likely has decreased perfusion of her extremity secondary to hypovolemic shock, but also she could possibly have lower extremity in light of her atrial fibrillation history.  I believe she currently needs resuscitation and treatment of her shock and is not in reasonable condition at this point for any sort of operation.  She is at extremely high risk of limb loss if this is an embolic event  and I discussed this at length with the patient's husband.  However, we need to consider the underlying systemic condition first and resuscitate her accordingly and try to improve her multisystem organ dysfunction at this point before embarking on further pursuits of her lower extremities.  I believe the best treatment for now would be an IV heparin drip.  If her limbs are still ischemic after resuscitation, we will consider further intervention at that point.  We will order formal arterial Dopplers of the ABIs in the morning to establish a baseline.  I discussed all of these findings with the patient, her husband as well as the primary medical service.  We will follow closely.     Cynthia Hora. Vihan Santagata, MD    CEF/MEDQ  D:  10/07/2010  T:  10/08/2010  Job:  [id number]  Electronically Signed by Fabienne Bruns MD on 10/12/2010 08:00:52 AM

## 2010-10-13 ENCOUNTER — Inpatient Hospital Stay (HOSPITAL_COMMUNITY): Payer: BC Managed Care – PPO

## 2010-10-13 DIAGNOSIS — I8010 Phlebitis and thrombophlebitis of unspecified femoral vein: Secondary | ICD-10-CM

## 2010-10-13 LAB — GLUCOSE, CAPILLARY
Glucose-Capillary: 242 mg/dL — ABNORMAL HIGH (ref 70–99)
Glucose-Capillary: 294 mg/dL — ABNORMAL HIGH (ref 70–99)
Glucose-Capillary: 259 mg/dL — ABNORMAL HIGH (ref 70–99)
Glucose-Capillary: 102 mg/dL — ABNORMAL HIGH (ref 70–99)

## 2010-10-13 LAB — RENAL FUNCTION PANEL
Albumin: 1.5 g/dL — ABNORMAL LOW (ref 3.5–5.2)
Calcium: 8.4 mg/dL (ref 8.4–10.5)
Creatinine, Ser: 4.37 mg/dL — ABNORMAL HIGH (ref 0.4–1.2)
GFR calc Af Amer: 13 mL/min — ABNORMAL LOW (ref >60)
GFR calc non Af Amer: 11 mL/min — ABNORMAL LOW (ref >60)
Sodium: 127 mEq/L — ABNORMAL LOW (ref 135–145)

## 2010-10-13 LAB — APTT: aPTT: 60 seconds — ABNORMAL HIGH (ref 24–37)

## 2010-10-13 LAB — CBC
Hemoglobin: 6.2 g/dL — CL (ref 12.0–15.0)
MCH: 29.8 pg (ref 26.0–34.0)
MCHC: 28.3 g/dL — ABNORMAL LOW (ref 30.0–36.0)
MCHC: 33.7 g/dL (ref 30.0–36.0)
MCV: 88.5 fL (ref 78.0–100.0)
Platelets: 260 10*3/uL (ref 150–400)
Platelets: 216 10*3/uL (ref 150–400)
RDW: 17.8 % — ABNORMAL HIGH (ref 11.5–15.5)

## 2010-10-13 LAB — PROTIME-INR: Prothrombin Time: 19.8 seconds — ABNORMAL HIGH (ref 11.6–15.2)

## 2010-10-13 LAB — HEPARIN LEVEL (UNFRACTIONATED): Heparin Unfractionated: 0.42 IU/mL (ref 0.30–0.70)

## 2010-10-14 ENCOUNTER — Inpatient Hospital Stay (HOSPITAL_COMMUNITY): Payer: BC Managed Care – PPO

## 2010-10-14 LAB — COMPREHENSIVE METABOLIC PANEL
Albumin: 2.6 g/dL — ABNORMAL LOW (ref 3.5–5.2)
Alkaline Phosphatase: 86 U/L (ref 39–117)
BUN: 77 mg/dL — ABNORMAL HIGH (ref 6–23)
CO2: 24 mEq/L (ref 19–32)
Chloride: 89 mEq/L — ABNORMAL LOW (ref 96–112)
Creatinine, Ser: 3.77 mg/dL — ABNORMAL HIGH (ref 0.4–1.2)
GFR calc non Af Amer: 13 mL/min — ABNORMAL LOW (ref >60)
Glucose, Bld: 127 mg/dL — ABNORMAL HIGH (ref 70–99)
Potassium: 3.9 mEq/L (ref 3.5–5.1)
Total Bilirubin: 0.4 mg/dL (ref 0.3–1.2)

## 2010-10-14 LAB — GLUCOSE, CAPILLARY
Glucose-Capillary: 136 mg/dL — ABNORMAL HIGH (ref 70–99)
Glucose-Capillary: 144 mg/dL — ABNORMAL HIGH (ref 70–99)

## 2010-10-14 LAB — CULTURE, BLOOD (ROUTINE X 2)
Culture  Setup Time: 201205030614
Culture: NO GROWTH

## 2010-10-14 LAB — MAGNESIUM: Magnesium: 2.4 mg/dL (ref 1.5–2.5)

## 2010-10-14 LAB — PREPARE FRESH FROZEN PLASMA
Unit division: 0
Unit division: 0

## 2010-10-14 LAB — PROTIME-INR: INR: 1.53 — ABNORMAL HIGH (ref 0.00–1.49)

## 2010-10-14 LAB — CBC
HCT: 19.8 % — ABNORMAL LOW (ref 36.0–46.0)
MCH: 30 pg (ref 26.0–34.0)
MCHC: 33.8 g/dL (ref 30.0–36.0)
RDW: 16 % — ABNORMAL HIGH (ref 11.5–15.5)

## 2010-10-14 LAB — HEMOGLOBIN AND HEMATOCRIT, BLOOD
HCT: 27.6 % — ABNORMAL LOW (ref 36.0–46.0)
HCT: 27.1 % — ABNORMAL LOW (ref 36.0–46.0)
Hemoglobin: 9.3 g/dL — ABNORMAL LOW (ref 12.0–15.0)

## 2010-10-14 LAB — PHOSPHORUS: Phosphorus: 7.6 mg/dL — ABNORMAL HIGH (ref 2.3–4.6)

## 2010-10-15 ENCOUNTER — Inpatient Hospital Stay (HOSPITAL_COMMUNITY): Payer: BC Managed Care – PPO

## 2010-10-15 LAB — RENAL FUNCTION PANEL
Albumin: 2.6 g/dL — ABNORMAL LOW (ref 3.5–5.2)
BUN: 74 mg/dL — ABNORMAL HIGH (ref 6–23)
CO2: 24 mEq/L (ref 19–32)
Chloride: 91 mEq/L — ABNORMAL LOW (ref 96–112)
Glucose, Bld: 136 mg/dL — ABNORMAL HIGH (ref 70–99)
Potassium: 3.7 mEq/L (ref 3.5–5.1)

## 2010-10-15 LAB — CBC
HCT: 28.7 % — ABNORMAL LOW (ref 36.0–46.0)
Hemoglobin: 9.8 g/dL — ABNORMAL LOW (ref 12.0–15.0)
MCH: 30.2 pg (ref 26.0–34.0)
MCHC: 34.1 g/dL (ref 30.0–36.0)
MCV: 88.3 fL (ref 78.0–100.0)
RBC: 3.25 MIL/uL — ABNORMAL LOW (ref 3.87–5.11)

## 2010-10-15 LAB — GLUCOSE, CAPILLARY
Glucose-Capillary: 152 mg/dL — ABNORMAL HIGH (ref 70–99)
Glucose-Capillary: 168 mg/dL — ABNORMAL HIGH (ref 70–99)
Glucose-Capillary: 149 mg/dL — ABNORMAL HIGH (ref 70–99)

## 2010-10-16 LAB — GLUCOSE, CAPILLARY
Glucose-Capillary: 210 mg/dL — ABNORMAL HIGH (ref 70–99)
Glucose-Capillary: 190 mg/dL — ABNORMAL HIGH (ref 70–99)
Glucose-Capillary: 175 mg/dL — ABNORMAL HIGH (ref 70–99)

## 2010-10-16 LAB — COMPREHENSIVE METABOLIC PANEL
ALT: 17 U/L (ref 0–35)
AST: 12 U/L (ref 0–37)
Alkaline Phosphatase: 105 U/L (ref 39–117)
CO2: 25 mEq/L (ref 19–32)
Calcium: 9 mg/dL (ref 8.4–10.5)
Chloride: 90 mEq/L — ABNORMAL LOW (ref 96–112)
GFR calc Af Amer: 16 mL/min — ABNORMAL LOW (ref >60)
GFR calc non Af Amer: 13 mL/min — ABNORMAL LOW (ref >60)
Glucose, Bld: 199 mg/dL — ABNORMAL HIGH (ref 70–99)
Potassium: 3.6 mEq/L (ref 3.5–5.1)
Sodium: 130 mEq/L — ABNORMAL LOW (ref 135–145)

## 2010-10-16 LAB — CBC
HCT: 25.9 % — ABNORMAL LOW (ref 36.0–46.0)
Hemoglobin: 8.7 g/dL — ABNORMAL LOW (ref 12.0–15.0)
MCH: 29.9 pg (ref 26.0–34.0)
RBC: 2.91 MIL/uL — ABNORMAL LOW (ref 3.87–5.11)

## 2010-10-16 LAB — FERRITIN: Ferritin: 3560 ng/mL — ABNORMAL HIGH (ref 10–291)

## 2010-10-16 LAB — IRON AND TIBC
Iron: 61 ug/dL (ref 42–135)
UIBC: 72 ug/dL

## 2010-10-17 LAB — COMPREHENSIVE METABOLIC PANEL
ALT: 14 U/L (ref 0–35)
AST: 9 U/L (ref 0–37)
Alkaline Phosphatase: 99 U/L (ref 39–117)
CO2: 26 mEq/L (ref 19–32)
Chloride: 91 mEq/L — ABNORMAL LOW (ref 96–112)
GFR calc Af Amer: 16 mL/min — ABNORMAL LOW (ref >60)
GFR calc non Af Amer: 13 mL/min — ABNORMAL LOW (ref >60)
Glucose, Bld: 157 mg/dL — ABNORMAL HIGH (ref 70–99)
Sodium: 129 mEq/L — ABNORMAL LOW (ref 135–145)
Total Bilirubin: 0.4 mg/dL (ref 0.3–1.2)

## 2010-10-17 LAB — CBC
HCT: 24 % — ABNORMAL LOW (ref 36.0–46.0)
Hemoglobin: 8 g/dL — ABNORMAL LOW (ref 12.0–15.0)
RBC: 2.67 MIL/uL — ABNORMAL LOW (ref 3.87–5.11)

## 2010-10-17 LAB — CROSSMATCH
Unit division: 0
Unit division: 0
Unit division: 0
Unit division: 0

## 2010-10-17 LAB — GLUCOSE, CAPILLARY
Glucose-Capillary: 191 mg/dL — ABNORMAL HIGH (ref 70–99)
Glucose-Capillary: 179 mg/dL — ABNORMAL HIGH (ref 70–99)
Glucose-Capillary: 119 mg/dL — ABNORMAL HIGH (ref 70–99)

## 2010-10-18 LAB — RENAL FUNCTION PANEL
CO2: 25 mEq/L (ref 19–32)
Calcium: 8.9 mg/dL (ref 8.4–10.5)
Chloride: 89 mEq/L — ABNORMAL LOW (ref 96–112)
Creatinine, Ser: 3.62 mg/dL — ABNORMAL HIGH (ref 0.4–1.2)
GFR calc Af Amer: 16 mL/min — ABNORMAL LOW (ref >60)
GFR calc non Af Amer: 13 mL/min — ABNORMAL LOW (ref >60)
Glucose, Bld: 138 mg/dL — ABNORMAL HIGH (ref 70–99)
Sodium: 128 mEq/L — ABNORMAL LOW (ref 135–145)

## 2010-10-18 LAB — GLUCOSE, CAPILLARY
Glucose-Capillary: 159 mg/dL — ABNORMAL HIGH (ref 70–99)
Glucose-Capillary: 167 mg/dL — ABNORMAL HIGH (ref 70–99)
Glucose-Capillary: 110 mg/dL — ABNORMAL HIGH (ref 70–99)
Glucose-Capillary: 141 mg/dL — ABNORMAL HIGH (ref 70–99)
Glucose-Capillary: 87 mg/dL (ref 70–99)
Glucose-Capillary: 153 mg/dL — ABNORMAL HIGH (ref 70–99)

## 2010-10-18 LAB — CBC
HCT: 26.2 % — ABNORMAL LOW (ref 36.0–46.0)
Hemoglobin: 8.6 g/dL — ABNORMAL LOW (ref 12.0–15.0)
MCH: 29.8 pg (ref 26.0–34.0)
MCHC: 32.8 g/dL (ref 30.0–36.0)
RBC: 2.89 MIL/uL — ABNORMAL LOW (ref 3.87–5.11)

## 2010-10-18 LAB — BASIC METABOLIC PANEL
CO2: 25 mEq/L (ref 19–32)
Chloride: 91 mEq/L — ABNORMAL LOW (ref 96–112)
GFR calc Af Amer: 17 mL/min — ABNORMAL LOW (ref >60)
Potassium: 3.3 mEq/L — ABNORMAL LOW (ref 3.5–5.1)
Sodium: 128 mEq/L — ABNORMAL LOW (ref 135–145)

## 2010-10-18 LAB — MRSA PCR SCREENING: MRSA by PCR: NEGATIVE

## 2010-10-18 LAB — DIFFERENTIAL
Lymphocytes Relative: 2 % — ABNORMAL LOW (ref 12–46)
Monocytes Absolute: 1.2 10*3/uL — ABNORMAL HIGH (ref 0.1–1.0)
Monocytes Relative: 5 % (ref 3–12)
Neutro Abs: 23.7 10*3/uL — ABNORMAL HIGH (ref 1.7–7.7)
Neutrophils Relative %: 93 % — ABNORMAL HIGH (ref 43–77)

## 2010-10-18 NOTE — H&P (Signed)
[name], CANEVARI NO.:  [id number]  MEDICAL RECORD NO.:  [id number]           PATIENT TYPE:  E  LOCATION:  [facility]                         FACILITY:  [facility]  PHYSICIAN:  Eduard Clos, MDDATE OF BIRTH:  19-Jul-1960  DATE OF ADMISSION:  10/07/2010 DATE OF DISCHARGE:                             HISTORY & PHYSICAL   PRIMARY NEPHROLOGIST:  Wilber Bihari. Caryn Section, MD  PRIMARY CARDIOLOGIST:  Bevelyn Buckles. Bensimhon, MD  CHIEF COMPLAINT:  Lethargy and confusion.  HISTORY OF PRESENT ILLNESS:  This is a 50 year old female who was in the hospital and discharged on September 14, 2010, a month ago.  At that time was treated for pneumonia and C. diff colitis, has not been doing well for the last 2-3 days and patient is becoming more lethargic and has not been eating well, has had some episode of nausea and vomiting and she used to walk without any help, at this time, she is more bed-bound last 2 days because she is feeling intensely weak.  The patient was brought to the ER.  In the ER, the patient's chest x-ray shows progressive pneumonia.  In addition, the patient was found to have CBG of 38, after D50 was given it was some 80.  The patient is more alert at this time but still drowsy.  The patient is having a cough at present and it has been ongoing for last many years, but she has been producing some phlegm also lasted 3 days.  The patient's husband did not notice any fever but with a questionable chills.  There was no diarrhea.  The patient in fact has not moved bowels for a couple of days.  Denies any chest pain or shortness of breath.  Denies any headache or visual symptoms, any new focal deficits.  In addition, the patient has been noticed to have increasing swelling and pain in the right lower extremity more than usual.  The patient does complain of generalized body ache.  In addition, the patient's creatinine is found to be around 2. Nephrologist on-call, Dr. Caryn Section, was  contacted by Dr. Earlene Plater and who advised to give her fluids at this time, probably, the patient is dehydrated.  At this time, the patient will be admitted for pneumonia, renal failure and SIRS.  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 diff and pneumonia, history of systemic lupus.  MEDICATIONS ON ADMISSION: 1. Lasix 40 mg p.o. b.i.d. 2. Prednisone 5 mg p.o. b.i.d. 3. Aranesp 200 mcg subcutaneous twice a week. 4. Tessalon capsules. 5. Clonazepam 5 mg p.o. daily. 6. Clonidine 0.2 mg p.o. b.i.d. 7. Diltiazem CD 180 mg p.o. daily. 8. Guaifenesin 1400 mg tablets p.o. b.i.d. 9. Hectorol 0.5 mcg p.o. daily. 10.Metoprolol XL 100 mg p.o. b.i.d. 11.2 tablets p.o. b.i.d. 12.Prednisone 5 mg p.o. b.i.d. 13.Potassium chloride 20 mEq p.o. daily. 14.Tylenol.  ALLERGIES:  COMPAZINE.  SOCIAL HISTORY:  The  patient is married.  Lives with her husband. Denies smoking cigarettes, drinking alcohol or using illegal drugs.  FAMILY HISTORY:  Nothing contributory.  REVIEW OF SYSTEMS:  As per history of present illness, nothing else significant.  PHYSICAL EXAMINATION:  GENERAL:  The patient examined at bedside, appears drowsy but easily arousable. VITAL SIGNS:  Blood pressure is around 95/50, pulse is 90 per minute, temperature 96.8, respirations 20 per minute, O2 sat is 100%. HEENT: Anicteric.  No pallor.  No discharge from ears, eyes, nose, and mouth. CHEST:  I do not see any discoloration of the tongue at this time. Tongue is midline.  She has bilateral air entry present.  No rhonchi. There is mild crepitation and mildly bronchial breathing in both sides. HEART:  S1, S2 heard. ABDOMEN:  Soft, mild tenderness in the  lower quadrants both sides.  No guarding or rigidity.  Bowel sounds present. CNS:  The patient is drowsy, arousable, follows commands.  Moves upper and lower extremities. EXTREMITIES:  There is right lower extremity edema, which is chronic but is cold and there is also some discoloration.  I do not feel the pulses. The pulses are not dopplerable.  LABORATORY DATA:  EKG shows atrial flutter, beats around 90 beats per minute with nonspecific ST changes.  Chest x-ray shows progression of right lower lobe airspace disease, pneumonia.  CBC:  WBC is 28.6, hemoglobin 12.3, hematocrit is 39.4, platelets 297, neutrophils 90%. CBG on arrival was 38, now 84, procalcitonin is 45.  Lactic acid is 0.9. Blood culture is pending.  Basic metabolic panel:  Sodium is 130, potassium 5.3, chloride 113, glucose 61 and now is 84, BUN 18, creatinine 6.1.  Hematocrit is 42, hemoglobin is 14.3.  ASSESSMENT: 1. SIRS. 2. Progressive pneumonia. 3. Acute renal failure with history of renal transplant x2, she is on     antisuppresant. 4. Nausea, vomiting with a recent C. diff colitis. 5. Chronic right lower extremity edema with cold feet. 6. History of hematoma of the left feet, was on Coumadin required     multiple surgeries. 7. Atrial flutter/atrial fibrillation presently moderately rate     controlled. 8. History of diastolic heart failure, last ejection fraction measured     in January 2011 was 55%. 9. History of lupus. 10.History of vesicocolonic fistula requiring partial colectomy.  PLAN: 1. At this time, we will admit the patient to step-down. 2. For her SIRS, which possibly is from pneumonia.  We will place the     patient on antibiotics for healthcare associated pneumonia, the     patient was recently in the hospital.  We will get blood cultures.     We will also get urine cultures, stool studies as the patient has     had recent C. diff.  The patient does have significant leukocytosis     at this  time along with the antibiotics for pneumonia, I am also     adding Flagyl.  The patient does have mild tenderness in lower     quadrants.  I will get a CT abdomen with only p.o. contrast to make     sure there is no recurring colitis. 3. Acute renal failure with history of renal transplant trying to on     antisuppresant.  We will hydrate the patient, closely follow     intake/output, daily weights and make sure not getting into any     fluid overload.  Repeat labs in a.m., and if her creatinine does  not make much improvement, then we need to consult Nephrology.  At     this time, the patient is on prednisone, I am going to give some     stress dose of Solu-Medrol until the patient can reliably take     prednisone p.o. 4. Chronic right lower extremity edema.  The patient's right lower     extremity is cold and I am not able to find pulse for the Doppler.     The patient is having mild pain in the lower extremity.  I will ask     Vascular Surgery consult to have a look at the patient. 5. History of atrial fibrillation, presently moderately controlled     rate.  The patient be mildly hypotensive.  We will hold rate     control medication at this time until the patient's blood pressure     becomes more stable, which I think will get better with IV fluids.     Further recommendation based on test order and clinical course.     Eduard Clos, MD     ANK/MEDQ  D:  10/07/2010  T:  10/07/2010  Job:  [id number]  cc:   Wilber Bihari. Caryn Section, M.D. Bevelyn Buckles. Bensimhon, MD  Electronically Signed by Midge Minium MD on 10/18/2010 09:02:44 AM

## 2010-10-19 LAB — RENAL FUNCTION PANEL
CO2: 24 mEq/L (ref 19–32)
Calcium: 8.7 mg/dL (ref 8.4–10.5)
GFR calc Af Amer: 16 mL/min — ABNORMAL LOW (ref >60)
GFR calc non Af Amer: 14 mL/min — ABNORMAL LOW (ref >60)
Glucose, Bld: 125 mg/dL — ABNORMAL HIGH (ref 70–99)
Phosphorus: 5.4 mg/dL — ABNORMAL HIGH (ref 2.3–4.6)
Potassium: 3.1 mEq/L — ABNORMAL LOW (ref 3.5–5.1)
Sodium: 129 mEq/L — ABNORMAL LOW (ref 135–145)

## 2010-10-19 LAB — GLUCOSE, CAPILLARY
Glucose-Capillary: 119 mg/dL — ABNORMAL HIGH (ref 70–99)
Glucose-Capillary: 125 mg/dL — ABNORMAL HIGH (ref 70–99)

## 2010-10-19 LAB — CBC
HCT: 25.7 % — ABNORMAL LOW (ref 36.0–46.0)
Hemoglobin: 8.5 g/dL — ABNORMAL LOW (ref 12.0–15.0)
WBC: 20.2 10*3/uL — ABNORMAL HIGH (ref 4.0–10.5)

## 2010-10-19 NOTE — Op Note (Signed)
[name], [name]               ACCOUNT NO.:  [id number]  MEDICAL RECORD NO.:  [id number]           PATIENT TYPE:  I  LOCATION:  2104                         FACILITY:  [facility]  PHYSICIAN:  Juleen China IV, MDDATE OF BIRTH:  04-25-1961  DATE OF PROCEDURE:  10/13/2010 DATE OF DISCHARGE:                              OPERATIVE REPORT   PREOPERATIVE DIAGNOSES: 1. Right leg deep venous thrombosis. 2. Retroperitoneal hematoma.  POSTOPERATIVE DIAGNOSES: 1. Right leg deep venous thrombosis. 2. Retroperitoneal hematoma.  PROCEDURES PERFORMED: 1. Ultrasound access left femoral vein. 2. Catheter in vena cava x1. 3. Placement of Bard Eclipse  inferior vena cava filter.  INDICATION:  Ms. [name] is a 50 year old female with a right leg deep vein thrombosis.  She had been anticoagulated for this, however, developed a large retroperitoneal hematoma while on anticoagulation. She has therefore been recommended for filter placement.  Of note, the patient has had 2 kidney transplants and is in acute renal failure.  She has a CT scan without contrast, which shows compression of the inferior vena cava.  DESCRIPTION OF PROCEDURE:  The patient was identified in the holding area and taken to room 8, placed supine on the table.  Left groin was prepped and draped in usual fashion.  The patient had an INR of greater than 4 prior to the procedure.  This had been reversed with plasma.  It was now 1.5.  She also had a creatinine greater than 4; therefore, contrast was not able to be used.  I reviewed her previous CT scans in order to place her filter based on bony landmarks.  The left common femoral vein was evaluated with ultrasound.  It was widely patent and easily compressible.  Digital ultrasound image was obtained.  Lidocaine 1% was used for local anesthesia.  The left common femoral vein was then accessed under ultrasound guidance with an 18- gauge needle.  An 0.035 wire was then advanced  into the vena cava under fluoroscopic visualization.  I then used a J-wire and the Omni flush catheter to locate the vena cava bifurcation.  This was marked on the screen with an ink pen to make sure that I did not deploy the filter below the bifurcation.  The filter sheath was then introduced over the wire into the vena cava.  The filter was prepared on the back table and then loaded through the sheath.  The filter was positioned at L3 vertebral body and then deployed.  The filter did not fully expand, likely secondary to the retroperitoneal hematoma compressing the vena cava.  However, it was deployed so that it would function properly.  It was then deployed in a straight fashion.  The sheath was then removed and manual pressure was held until hemostasis was achieved.  There were no immediate complications.  IMPRESSION:  Successful placement of Bard Eclipse removable inferior vena cava filter at the L3 vertebral body.     Jorge Ny, MD     VWB/MEDQ  D:  10/13/2010  T:  10/14/2010  Job:  [id number]  Electronically Signed by Arelia Longest IV MD on 10/19/2010 11:01:49 PM

## 2010-10-20 LAB — COMPREHENSIVE METABOLIC PANEL
Albumin: 1.9 g/dL — ABNORMAL LOW (ref 3.5–5.2)
Alkaline Phosphatase: 103 U/L (ref 39–117)
BUN: 86 mg/dL — ABNORMAL HIGH (ref 6–23)
CO2: 24 mEq/L (ref 19–32)
Chloride: 97 mEq/L (ref 96–112)
Creatinine, Ser: 3.5 mg/dL — ABNORMAL HIGH (ref 0.4–1.2)
GFR calc non Af Amer: 14 mL/min — ABNORMAL LOW (ref >60)
Glucose, Bld: 110 mg/dL — ABNORMAL HIGH (ref 70–99)
Potassium: 4 mEq/L (ref 3.5–5.1)
Total Bilirubin: 0.5 mg/dL (ref 0.3–1.2)

## 2010-10-20 LAB — CBC
HCT: 24.5 % — ABNORMAL LOW (ref 36.0–46.0)
Hemoglobin: 8 g/dL — ABNORMAL LOW (ref 12.0–15.0)
MCH: 30.1 pg (ref 26.0–34.0)
MCV: 92.1 fL (ref 78.0–100.0)
RBC: 2.66 MIL/uL — ABNORMAL LOW (ref 3.87–5.11)
WBC: 17.9 10*3/uL — ABNORMAL HIGH (ref 4.0–10.5)

## 2010-10-20 LAB — DIFFERENTIAL
Basophils Absolute: 0 10*3/uL (ref 0.0–0.1)
Basophils Relative: 0 % (ref 0–1)
Lymphocytes Relative: 5 % — ABNORMAL LOW (ref 12–46)
Lymphs Abs: 0.9 10*3/uL (ref 0.7–4.0)
Monocytes Relative: 8 % (ref 3–12)
Neutro Abs: 15.6 10*3/uL — ABNORMAL HIGH (ref 1.7–7.7)

## 2010-10-20 LAB — GLUCOSE, CAPILLARY
Glucose-Capillary: 92 mg/dL (ref 70–99)
Glucose-Capillary: 103 mg/dL — ABNORMAL HIGH (ref 70–99)

## 2010-10-20 LAB — PHOSPHORUS: Phosphorus: 5.7 mg/dL — ABNORMAL HIGH (ref 2.3–4.6)

## 2010-10-20 NOTE — Cardiovascular Report (Signed)
[name], [name] NO.:  [id number]   MEDICAL RECORD NO.:  [id number]          PATIENT TYPE:  OIB   LOCATION:  2899                         FACILITY:  [facility]   PHYSICIAN:  Bevelyn Buckles. Bensimhon, MDDATE OF BIRTH:  31-Jul-1960   DATE OF PROCEDURE:  02/15/2007  DATE OF DISCHARGE:                            CARDIAC CATHETERIZATION   PROCEDURE:  Direct current cardioversion.   [name] is a delightful 50 year old woman with a history of lupus and  related lupus nephropathy.  She is status post kidney transplant.  She  has a history of atrial fibrillation and has been cardioverted multiple  times.  She has been maintained on flecainide and has not had any  breakthrough atrial fibrillation for almost 2 years.  Over the weekend  she developed recurrent symptomatic atrial fibrillation.  She was seen  in the office by Dr. Juanito Doom and cardioversion was arranged.  Her INR  has been therapeutic for over 6 weeks.   Informed consent was signed and placed on the chart.  Sedation was  achieved by anesthesia with Dr. Katrinka Blazing using propofol.  Once adequate  sedation was achieve, she received a single 150 joules biphasic shock  with prompt reversion to normal sinus rhythm.  There were no apparent  complications.      Bevelyn Buckles. Bensimhon, MD  Electronically Signed     DRB/MEDQ  D:  02/15/2007  T:  02/15/2007  Job:  [id number]

## 2010-10-20 NOTE — Assessment & Plan Note (Signed)
[facility]                                 ON-CALL NOTE   [name], [name]                      MRN:          [id number]  DATE:02/10/2007                            DOB:          12/14/1960    TIME OF CALL:  3:45am   PRIMARY CARE PHYSICIAN:  Dr. Gala Romney.   [name] called this morning saying that she feels like she is  atrial fibrillation. The patient has had atrial fibrillation in the  past, but thinks that she has been in sinus rhythm for the past two  years. She takes Flecainide and Coumadin. She has been cardioverted  multiple times in the past. She works second shift, got home last night  or early this morning, and suddenly became aware of irregular tachy-  palpitations. At this point, she is quite certain that she has only been  in atrial fibrillation for approximately one hour. She has no syncope,  pre-syncope, chest pain, or shortness of breath. I advised her to drink  two glasses of water, take an extra Flecainide right now, and try to get  some sleep. If she is unable to sleep or if she wakes up and still feels  like she is in atrial fibrillation, she is going to contact Dr.  Gala Romney and will probably need to come in for a cardioversion. The  patient is agreeable to this plan. She also understands that if she  develops symptoms or the atrial fibrillation becomes intolerable that  she is welcome to come in to the hospital at any point tonight and I  would be happy to admit her. She is going to try to stay home for the  next little while and see how things go.     Christell Faith, MD  Electronically Signed    NDL/MedQ  DD: 02/10/2007  DT: 02/10/2007  Job #: [id number]

## 2010-10-20 NOTE — Op Note (Signed)
[name], [name] NO.:  [id number]   MEDICAL RECORD NO.:  [id number]          PATIENT TYPE:  OIB   LOCATION:  2858                         FACILITY:  [facility]   PHYSICIAN:  Bevelyn Buckles. Bensimhon, MDDATE OF BIRTH:  06-02-1961   DATE OF PROCEDURE:  09/01/2007  DATE OF DISCHARGE:                               OPERATIVE REPORT   PROCEDURE:  Direct current cardioversion.   Ms. Kendrick is a delightful 50 year old woman a history of lupus  nephropathy.  She is status post kidney transplant x2.  She has known  atrial fibrillation and has been maintained in sinus rhythm on  flecainide.  She presented to the office yesterday with recurrent atrial  fibrillation.  She has been on Coumadin with therapeutic INR.  She has  been fairly symptomatic with this.  She is brought in today for  cardioversion.   The risks and indication of the procedure were explained.  Consent was  signed and placed on the chart.  Sedation was provided by anesthesia  with IV Diprivan.  Once adequate sedation was achieved, she received a  single 150-joule synchronized biphasic shock with prompt reversion to  sinus rhythm.  There were no apparent complications.   This is her second episode in several months.  Should she have recurrent  atrial fibrillation, would consider possible atrial fibrillation  ablation as I do not think she is a good amiodarone candidate.      Bevelyn Buckles. Bensimhon, MD  Electronically Signed     DRB/MEDQ  D:  09/01/2007  T:  09/01/2007  Job:  [id number]
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
## 2010-10-20 NOTE — Op Note (Signed)
[name], PROBUS NO.:  [id number]   MEDICAL RECORD NO.:  [id number]          PATIENT TYPE:  OIB   LOCATION:  2899                         FACILITY:  [facility]   PHYSICIAN:  Bevelyn Buckles. Bensimhon, MDDATE OF BIRTH:  [birth year]   DATE OF PROCEDURE:  10/23/2007  DATE OF DISCHARGE:                               OPERATIVE REPORT   Direct current cardioversion.   PATIENT INDICATION:  [name] is a delightful 50 year old woman with a  history of lupus with related kidney disease.  She is status post two  kidney transplants.  She continues with mild to moderate renal  insufficiency.  She has been plagued by recurrent atrial fibrillation.  She has been maintained on flecainide 150 b.i.d.  Most recently she had  recurrent atrial fibrillation which was symptomatic about 7 weeks ago  and underwent cardioversion.  Unfortunately over the weekend she had  recurrent atrial fibrillation which was fairly symptomatic despite  adequate rate control.  She was brought in today for elective  cardioversion.   After appropriate sedation by anesthesia, she was cardioverted with a  single 200 joule biphasic shock with prompt reversion to normal sinus  rhythm.  INR prior to the procedure was 2.1.   CONCLUSION:  1. Successful direct current cardioversion of recurrent atrial      fibrillation.  2. She has obviously failed flecainide therapy.  We sill stop this and      treat her with amiodarone.  We will bring her back to the office in      the next week or two to discuss possible atrial fibrillation      ablation.      Bevelyn Buckles. Bensimhon, MD  Electronically Signed     DRB/MEDQ  D:  10/23/2007  T:  10/23/2007  Job:  [id number]
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
## 2010-10-21 LAB — CBC
Hemoglobin: 7.8 g/dL — ABNORMAL LOW (ref 12.0–15.0)
MCH: 30.8 pg (ref 26.0–34.0)
MCHC: 32.9 g/dL (ref 30.0–36.0)
MCHC: 33.3 g/dL (ref 30.0–36.0)
Platelets: 117 10*3/uL — ABNORMAL LOW (ref 150–400)
RDW: 17.5 % — ABNORMAL HIGH (ref 11.5–15.5)
RDW: 17.7 % — ABNORMAL HIGH (ref 11.5–15.5)
WBC: 15.5 10*3/uL — ABNORMAL HIGH (ref 4.0–10.5)

## 2010-10-21 LAB — CLOSTRIDIUM DIFFICILE BY PCR: Toxigenic C. Difficile by PCR: NEGATIVE

## 2010-10-21 LAB — RENAL FUNCTION PANEL
CO2: 23 mEq/L (ref 19–32)
Calcium: 8.8 mg/dL (ref 8.4–10.5)
GFR calc Af Amer: 16 mL/min — ABNORMAL LOW (ref >60)
GFR calc non Af Amer: 13 mL/min — ABNORMAL LOW (ref >60)
Phosphorus: 5.9 mg/dL — ABNORMAL HIGH (ref 2.3–4.6)
Potassium: 3.6 mEq/L (ref 3.5–5.1)
Sodium: 133 mEq/L — ABNORMAL LOW (ref 135–145)

## 2010-10-21 LAB — DIFFERENTIAL
Basophils Absolute: 0 10*3/uL (ref 0.0–0.1)
Basophils Relative: 0 % (ref 0–1)
Monocytes Absolute: 1.1 10*3/uL — ABNORMAL HIGH (ref 0.1–1.0)
Neutro Abs: 13.9 10*3/uL — ABNORMAL HIGH (ref 1.7–7.7)

## 2010-10-21 LAB — URINE MICROSCOPIC-ADD ON

## 2010-10-21 LAB — GLUCOSE, CAPILLARY
Glucose-Capillary: 106 mg/dL — ABNORMAL HIGH (ref 70–99)
Glucose-Capillary: 103 mg/dL — ABNORMAL HIGH (ref 70–99)
Glucose-Capillary: 128 mg/dL — ABNORMAL HIGH (ref 70–99)

## 2010-10-21 LAB — URINALYSIS, ROUTINE W REFLEX MICROSCOPIC
Bilirubin Urine: NEGATIVE
Ketones, ur: NEGATIVE mg/dL
Nitrite: NEGATIVE
Protein, ur: 100 mg/dL — AB
Urobilinogen, UA: 0.2 mg/dL (ref 0.0–1.0)

## 2010-10-22 LAB — CBC
HCT: 25.7 % — ABNORMAL LOW (ref 36.0–46.0)
Hemoglobin: 8.2 g/dL — ABNORMAL LOW (ref 12.0–15.0)
MCHC: 31.9 g/dL (ref 30.0–36.0)
RDW: 17.7 % — ABNORMAL HIGH (ref 11.5–15.5)
WBC: 13.1 10*3/uL — ABNORMAL HIGH (ref 4.0–10.5)

## 2010-10-22 LAB — RENAL FUNCTION PANEL
Albumin: 2.1 g/dL — ABNORMAL LOW (ref 3.5–5.2)
BUN: 95 mg/dL — ABNORMAL HIGH (ref 6–23)
Calcium: 9.2 mg/dL (ref 8.4–10.5)
Calcium: 9.1 mg/dL (ref 8.4–10.5)
Creatinine, Ser: 3.58 mg/dL — ABNORMAL HIGH (ref 0.4–1.2)
GFR calc Af Amer: 15 mL/min — ABNORMAL LOW (ref >60)
GFR calc non Af Amer: 12 mL/min — ABNORMAL LOW (ref >60)
Glucose, Bld: 99 mg/dL (ref 70–99)
Phosphorus: 5.8 mg/dL — ABNORMAL HIGH (ref 2.3–4.6)
Phosphorus: 5.9 mg/dL — ABNORMAL HIGH (ref 2.3–4.6)
Potassium: 2.9 mEq/L — ABNORMAL LOW (ref 3.5–5.1)
Sodium: 136 mEq/L (ref 135–145)

## 2010-10-22 LAB — DIFFERENTIAL
Basophils Absolute: 0 10*3/uL (ref 0.0–0.1)
Eosinophils Relative: 0 % (ref 0–5)
Lymphs Abs: 0.5 10*3/uL — ABNORMAL LOW (ref 0.7–4.0)
Monocytes Absolute: 1.8 10*3/uL — ABNORMAL HIGH (ref 0.1–1.0)
Monocytes Relative: 14 % — ABNORMAL HIGH (ref 3–12)
Neutrophils Relative %: 82 % — ABNORMAL HIGH (ref 43–77)

## 2010-10-22 LAB — URINE CULTURE
Colony Count: NO GROWTH
Culture: NO GROWTH

## 2010-10-22 LAB — GLUCOSE, CAPILLARY
Glucose-Capillary: 86 mg/dL (ref 70–99)
Glucose-Capillary: 99 mg/dL (ref 70–99)

## 2010-10-23 ENCOUNTER — Inpatient Hospital Stay
Admission: AD | Admit: 2010-10-23 | Discharge: 2010-10-25 | Disposition: A | Discharge status: Nursing Home (Includes ICF) | Payer: Self-pay | Source: Ambulatory Visit | Attending: Internal Medicine | Admitting: Internal Medicine

## 2010-10-23 LAB — CBC
HCT: 26.3 % — ABNORMAL LOW (ref 36.0–46.0)
Hemoglobin: 8.5 g/dL — ABNORMAL LOW (ref 12.0–15.0)
MCHC: 32.3 g/dL (ref 30.0–36.0)
MCV: 93.9 fL (ref 78.0–100.0)

## 2010-10-23 LAB — COMPREHENSIVE METABOLIC PANEL
ALT: 13 U/L (ref 0–35)
Albumin: 2 g/dL — ABNORMAL LOW (ref 3.5–5.2)
Calcium: 9.1 mg/dL (ref 8.4–10.5)
GFR calc Af Amer: 17 mL/min — ABNORMAL LOW (ref >60)
Glucose, Bld: 110 mg/dL — ABNORMAL HIGH (ref 70–99)
Sodium: 138 mEq/L (ref 135–145)
Total Protein: 4.8 g/dL — ABNORMAL LOW (ref 6.0–8.3)

## 2010-10-23 LAB — PTH, INTACT AND CALCIUM
Calcium, Total (PTH): 8.4 mg/dL (ref 8.4–10.5)
PTH: 489.1 pg/mL — ABNORMAL HIGH (ref 14.0–72.0)

## 2010-10-23 LAB — PHOSPHORUS: Phosphorus: 6.1 mg/dL — ABNORMAL HIGH (ref 2.3–4.6)

## 2010-10-23 NOTE — Assessment & Plan Note (Signed)
[facility]                                 ON-CALL NOTE   ANGLA, DELAHUNT                        MRN:          [id number]  DATE:07/05/2006                            DOB:          22-Mar-1961    CARDIOLOGIST:  Bevelyn Buckles. Bensimhon, MD.   PHONE NUMBER:  [phone]   HISTORY:  [name] is a 50 year old female patient with a history of  paroxysmal atrial fibrillation maintained on flecainide as well as  Coumadin therapy who also has a history of lupus nephritis,  hypertension, steroid-induced hyperglycemia and anemia.  She was taking  a shower earlier this evening and felt as though she went into atrial  fibrillation.  This lasted about 30 minutes to 1 hour.  She did a couple  of Valsalva maneuvers, and she feels like it broke on its own.  She  denies any chest pain, shortness of breath, syncope, or near syncope.  She denies any fatigue.  She usually feels tachy palpitations with this.  This is what she felt earlier; and she tracked her heart rate on her  blood pressure machine and it was in the 150s.  It is now in the 90s  which is about 20 points higher than she usually runs.   PLAN:  The patient feels as though her atrial fibrillation broke.  I  told her that if she felt as though she developed more tachy  palpitations or her heart rate rose back into the 150s she should come  to the emergency room for further evaluation.  As long as she continues  to do better tonight; and her heart rate returns to normal; she no  longer feels any tachy palpitations; she can be seen in the office  tomorrow in followup.  She has a Coumadin Clinic appointment tomorrow in  the office and we will try to get her squeezed into an appointment  around that time.   DISPOSITION:  As noted above.      Tereso Newcomer, PA-C  Electronically Signed      Jesse Sans. Daleen Squibb, MD, [facility]  Electronically Signed   SW/MedQ  DD: 07/05/2006  DT: 07/06/2006  Job #: [id number]

## 2010-10-23 NOTE — Consult Note (Signed)
NAMEMarland Kitchen  [name], [name]                         ACCOUNT NO.:  [id number]   MEDICAL RECORD NO.:  [id number]                   PATIENT TYPE:  INP   LOCATION:  4741                                 FACILITY:  [facility]   PHYSICIAN:  Pricilla Riffle, M.D.                 DATE OF BIRTH:  29-Dec-1960   DATE OF CONSULTATION:  07/11/2003  DATE OF DISCHARGE:                                   CONSULTATION   REFERRING PHYSICIAN:  Cecille Aver, M.D.   REASON FOR CONSULTATION:  The patient is a 50 year old who we were asked to  see regarding atrial fibrillation.   HISTORY OF PRESENT ILLNESS:  About one week ago on Thursday, the patient  began feeling somewhat short of breath with ambulation, complaining of  fatigue and some fluttering sensation in her chest that persisted over the  weekend.  On Monday, she went to [facility] and complained  of the fatigue.  Her Lopressor was stopped.  After that, she noticed more  palpitations and continued to feel poorly.  She represented today and EKG  was done that showed atrial fibrillation/flutter.  She was admitted for  further care.  Now, the patient is on IV Cardizem at 5 mg per hour with  heart rate in the 105-110 range.   The patient denied any chest pain, just fatigue.  No distinct PND noted,  mild dizziness, but no syncope.   ALLERGIES:  COMPAZINE.   PAST MEDICAL HISTORY:  1. History of lupus with nephritis.  2. History of cadaveric renal transplant.  3. History of living, unrelated renal transplant.  4. History of hypertension prior to transplant, questioned secondary to     immunosuppressant.  5. History of possible hypercholesterolemia.   CURRENT MEDICATIONS:  1. CellCept 500 mg t.i.d.  2. Aspirin 81 mg q.d.  3. Nexium 40 mg q.d.  4. Os-Cal with Vitamin D t.i.d.  5. Enalapril 2.5 mg q.d.  6. Prednisone 10 mg b.i.d.  7. Fosamax 5 mg q.d.  8. Ranitidine 75 mg q.d.   SOCIAL HISTORY:  The patient is married and lives in  [place].  She works  at [employer].  She does not smoke and does not drink.  Denies  caffeine.   FAMILY HISTORY:  Negative for coronary artery disease.   REVIEW OF SYMPTOMS:  Overall have reviewed, all systems negative with above  problem as noted.   PHYSICAL EXAMINATION:  GENERAL:  The patient is in no acute distress.  VITAL SIGNS:  Blood pressure 122/78, pulse 148 (ranges from 100s to 140),  temperature 98.  NECK:  JVP is normal.  No bruits.  LUNGS:  Clear.  CARDIAC:  Irregularly, irregular.  S1, S2.  Grade 2/6 systolic murmur heard  best at the apex.  No diastolic murmurs audible.  ABDOMEN:  Supple.  No hepatomegaly.  EXTREMITIES:  2+ pulses.  No lower extremity edema.  LABORATORY DATA AND X-RAY FINDINGS:  A 12-lead EKG shows atrial fibrillation  at a rate of 105 beats per minute.   IMPRESSION:  Ms. Knee is a 50 year old woman with a history of newly  diagnosed atrial fibrillation, now better rate controlled on Diltiazem,  although not optimal.  She is asymptomatic currently at rest.  By report,  she has a history of mild mitral regurgitation, mild mitral stenosis from an  echocardiogram in 2000.  On examination, the mitral regurgitation is  audible, but the diastolic rumble is not audible.   RECOMMENDATIONS:  1. I agree with anticoagulation.  Would use Lovenox given the patient is a     hard IV stick.  2. Can give Coumadin tonight.  Most likely will need long-term     coumadinization based on above, but would get an echocardiogram.  3. The patient has no distinct hypertension prior to transplant, but denies     problems, although she has been on low-dose medicines (for palpitations)     renal profusion.  4. I have talked to the patient about cardioversion and would keep the     patient NPO and consider TEE cardioversion tomorrow if possible.  Again,     will base also on review of the echocardiogram.  5. Agree with checking thyroid function.  6. Would add  Lopressor back to her regimen and follow blood pressure.  With     her possible changing renal function, this would be better than     Diltiazem.                                               Pricilla Riffle, M.D.    PVR/MEDQ  D:  07/11/2003  T:  07/12/2003  Job:  [id number]

## 2010-10-23 NOTE — Cardiovascular Report (Signed)
NAMEKELSEA, [name] NO.:  [id number]   MEDICAL RECORD NO.:  [id number]          PATIENT TYPE:  INP   LOCATION:  2910                         FACILITY:  [facility]   PHYSICIAN:  Arvilla Meres, M.D. [facility] OF BIRTH:  1960/06/24   DATE OF PROCEDURE:  08/13/2004  DATE OF DISCHARGE:                              CARDIAC CATHETERIZATION   PATIENT IDENTIFICATION:  [name] is a 50 year old woman with lupus  nephritis and recurrent symptomatic atrial fibrillation who is status post  cardioversion last week.  She was now admitted for flecainide load and  repeat cardioversion.  Over the past few days, she has been loaded in  flecainide and is for cardioversion today.  Risks and benefits of the  procedure were explained to [name].  Consent was signed and placed on the  chart.  Conscious sedation was administered by anesthesia with 175 mg of  sodium pentothal x 1.  She then received a synchronized biphasic shock at  150 joules x 1, with successful conversion to normal sinus rhythm at a rate  of 65-70.  There were no apparent complications.  The patient will be  discharged home later today with a therapeutic INR on Coumadin.      DB/MEDQ  D:  08/13/2004  T:  08/13/2004  Job:  [id number]

## 2010-10-23 NOTE — H&P (Signed)
[name], VO                         ACCOUNT NO.:  [id number]   MEDICAL RECORD NO.:  [id number]                   PATIENT TYPE:  INP   LOCATION:  4741                                 FACILITY:  [facility]   PHYSICIAN:  James L. Deterding, M.D.            DATE OF BIRTH:  09-17-1960   DATE OF ADMISSION:  07/11/2003  DATE OF DISCHARGE:                                HISTORY & PHYSICAL   PRIMARY CARE PHYSICIAN:  Wilber Bihari. Caryn Section, M.D.   CHIEF COMPLAINT:  New onset atrial fibrillation.   HISTORY OF PRESENT ILLNESS:  This is a 50 year old white female with a  history of a systemic lupus erythematosus status post renal transplant x2  who presents with new onset atrial fibrillation diagnosed at [facility] earlier today. [name] stated that she began feeling poorly  last Thursday. She had palpitations and significant dyspnea when performing  chores around the house and while walking to work. The same feeling  persisted through the weekend. On Monday, January 31, she returned to  [facility] and was found to be orthostatic with a creatinine of  2.0. Her baseline was around 1.6 to 1.8. Prednisone was increased to 5  b.i.d., and her Lopressor was stopped. She returned on February 3 for labs  and complained of her heart racing. EKG revealed atrial fibrillation. She  was given Toprol 50 mg p.o. x1 in the office and admitted for further  evaluation. Dr. Ladona Ridgel at [facility] was called who recommended admission for  further evaluation. Of note, she was finishing a course of Augmentin for a  UTI for physicians at [facility].   PAST MEDICAL HISTORY:  1. Status post systemic lupus erythematosus.  2. Status post cadaveric renal transplant in 1989 on the right side.  3. Status post __________ renal transplant 2001 on the left side at [facility].   MEDICATIONS:  1. Lopressor 25 mg p.o. q.d., discontinued on January 31.  2. Prednisone 5 mg b.i.d.  3. CellCept 500 mg t.i.d.  4.  Aspirin 81 mg p.o. q.d.  5. Nexium 40 mg one tablet p.o. q.d.  6. Os-Cal with vitamin D one tablet t.i.d.  7. Fosamax 70 mg p.o. every week.  8. Enalapril 2.5 mg p.o. q.d.   ALLERGIES:  COMPAZINE.   SOCIAL HISTORY:  She is married and works at [employer]. She has no  children. She denies any alcohol or tobacco.   FAMILY HISTORY:  Her mother died age 73 with breast cancer. Father died age  68 with renal failure secondary to polycystic kidney disease. All her  paternal siblings and one sister with polycystic kidney disease. She has  seven sisters and one brother. She also has SLE, alopecia, and Crohn's  disease in her family.   PHYSICAL EXAMINATION:  VITAL SIGNS:  Showed temperature of 98.0, heart rate  148, blood pressure 122/79, respiratory rate of 20, O2 saturations of 99% on  room air, weight 227.1 pound.  GENERAL:  She is pleasant, alert, in no acute distress.  HEENT:  She is normocephalic, atraumatic. Pupils are equal, round, and  reactive to light and accommodation. Extraocular movements intact. Her  oropharynx is benign.  NECK:  Supple. No lymphadenopathy.  CARDIOVASCULAR:  Irregular irregular rate around 130.  PULMONARY:  Clear to auscultation bilaterally and nonlabored.  ABDOMEN:  Obese, positive bowel sounds, soft, and no masses.  EXTREMITIES:  Reveal 2+ posterior tibial and no clubbing, cyanosis, or  edema.  NEUROLOGICAL:  Alert and oriented x3. Strength and sensation are normal.   STUDIES:  Telemetry is atrial fibrillation, atrial flutter, rate 130s to  150s. Echocardiogram in 2000 reveals mild mitral regurgitation and mild  mitral stenosis.   ASSESSMENT/PLAN:  We have a 50 year old white female with systemic lupus  erythematosus, history of renal transplant x2, admitted with new onset  atrial fibrillation/flutter.   1. Atrial fibrillation/flutter. She is status post __________ still with     increased heart rate. Will consider adding diltiazem bolus with  drip if     needed. We will check serial cardiac enzymes and a TSH. Will check a 2-D     and get a cardiology consult. Will anticoagulate with heparin and     Coumadin.  2. Chronic renal insufficiency. Baseline of 1.6 to 1.8 which is creatinine     clearance around 75 mL per minute. Now creatinine of 2.0, creatinine     clearance is estimated at 60 mL per minute. We will follow while in     hospital.  3. Status post renal transplant x2. Will continue CellCept while in     hospital.  4. Systemic lupus erythematosus. We will continue prednisone while in     hospital. Recently increased prednisone due to concern over possible     adrenal insufficiency.  5. Nutrition. Low salt diet while in hospital.  6. Hypertension. Continue enalapril. We will follow while on the beta     blocker or Cardizem.      [name], M.D.                        James L. Deterding, M.D.    AK/MEDQ  D:  07/11/2003  T:  07/11/2003  Job:  [id number]

## 2010-10-23 NOTE — H&P (Signed)
NAMENANCI, LAKATOS NO.:  [id number]   MEDICAL RECORD NO.:  [id number]          PATIENT TYPE:  EMS   LOCATION:  ED                           FACILITY:  [facility]   PHYSICIAN:  Loyola Mast, MD       DATE OF BIRTH:  [birth year]   DATE OF ADMISSION:  08/03/2004  DATE OF DISCHARGE:                                HISTORY & PHYSICAL   CHIEF COMPLAINT:  Heart palpitations.   HISTORY OF PRESENT ILLNESS:  This is a 50 year old female with a known  history of atrial fibrillation who currently presents with a two to three  hour history of palpitations with an associated antecedent coughing spell.  The patient has been apparently treated for an outpatient bronchial  infection with doxycycline.  Has also seen a pulmonologist for chronic  cough.  There are concerns whether she has underlying RSV as the patient has  chronic immunosuppression due to renal transplant medications.  She has also  been tested for CMV and these results are pending.  Nonetheless, she is  currently not on antiviral therapy.  Regarding her atrial fibrillation, the  patient has a known history of extending back to 2005 in February.  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  She was  cardioverted to sinus rhythm with 200 joules and otherwise had been on  Coumadin for six-month time and rate controlled on Cardizem and Toprol-XL.  She did not have evidence of hypothyroidism, hyperthyroidism.  Currently she  is off anticoagulation due to difficulty maintaining her INR in a  therapeutic range.  She is rate controlled as stated with a calcium channel  and beta blocker.   PAST MEDICAL HISTORY:  Significant for:  1.  Atrial fibrillation as stated above, rate controlled, not      anticoagulated.  2.  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  3.  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  Her first transplant failed      as she had been off immunosuppressants as she tried to get pregnant.      Currently she has been retransplanted in 2001 and her immunosuppressant      regimen includes CellCept and prednisone.  She had been previously on      cyclosporin and Prograf.  However, these have been discontinued due to      progressive renal dysfunction.  She is followed annually at [facility].  Her last kidney biopsy was in November 2005 with      evidence of fibrosis.  There is no evidence of JC virus.  She is      currently followed by Dr. Caryn Section of [facility].  4.  Hypertension, currently well controlled.  5.  Lupus, current quiescent.   ALLERGIES:  COMPAZINE.   MEDICATIONS:  1.  Aspirin 325 mg.  2.  Cardizem 120  daily.  3.  Toprol-XL 200 mg daily.  4.  CellCept 500 t.i.d.  5.  Prednisone 5 b.i.d.  6.  Fosamax, Os-Cal, Flonase, and Serevent.   PAST SURGICAL HISTORY:  Renal transplant x2.   SOCIAL HISTORY:  She is a Occupational psychologist for [employer]  She does not smoke.  She does not drink.  She is married.  She has  no children.   REVIEW OF SYSTEMS:  Significant for cough.  Otherwise no chest pain.  No  orthopnea, no lower extremity edema.  No dysuria, hematuria, melena, seizure  disorder, or migraine.   PHYSICAL EXAMINATION:  VITAL SIGNS:  Temperature 99.5, pulse initially was  185, currently in the 130's, respirations 18, saturating 98% on room air.  GENERAL:  She is obese without significant distress.  HEENT:  Pupils equal right and reactive to light.  There is no icterus.  NECK:  Jugular venous pulsations are left than 9 cm of water pressure.  CARDIOVASCULAR:  She is tachycardiac with a systolic murmur.  LUNGS:  Clear.  No dullness to percussion.  ABDOMEN:  There is a right lower abdominal scar consistent with nephrectomy  and cadaveric  placement of a renal transplant.  EXTREMITIES:  Without edema.   LABORATORY DATA:  EKG showed atrial fibrillation with a rapid ventricular  response with a heart rate of 155.  Her QRS duration is 80.  White blood  cell count is 12.8, hemoglobin 12.1, hematocrit 35.5.  Platelets 217, MCV  88.1.  Neutrophil count is 79%.  Sodium 138, potassium 4, chloride 106,  bicarb 24, anion gap is 8, glucose 113, BUN 24, creatinine 2.1, calcium  10.5, total protein is 6.4, albumin 3.4.  Myoglobin 175, CK-MB is 1.5,  troponin negative, less than 0.05.   ASSESSMENT:  Problem #1.  Atrial flutter.  At this point she will be  anticoagulated with heparin, rate controlled with Cardizem IV.  The patient  will continue her oral Toprol-XL.  Digoxin will not be administered.  A  cardiology consult has been requested and an effort to facilitate a  transesophageal echocardiogram to evaluate for left atrial thrombus.  She  has benefited from a cardioversion in the past and may also benefit on this  occasions.  However, considering her structural heart disease in the past  with left atrial diameter of 5.3 cm noted last year.  It is likely that she  may not maintain sinus rhythm.  In any event she has had problems with  Coumadin in the past.  Nonetheless, I have discussed the issue with her and  we will gingerly start her at 1 mg per day.  In terms of her redevelopment  of atrial fibrillation, this may be in the setting of occult pulmonary  infection. Her CMV titer is currently pending.  It is unclear as to whether  this is a titer or PCR.  Nonetheless, she has no gastrointestinal signs or  symptoms consistent with cytomegalovirus.  Respiratory syncytial virus is a  possibility.   Problem #2.  Renal transplant.  Will continue with CellCept and prednisone.  Her baseline creatinine is approximately 2.2 consistent with today's 2.1.  Problem #3.  Pulmonary.  She will receive humidifying oxygen and as stated  above, her  CMV titers are pending.   Problem #4.  Steroid-induced hyperglycemia.  She will have fingerstick blood  glucoses to help control her hypoglycemia.   Her husband is Merlyn Albert.  His phone number is [phone].  The patient is  a full  code.     JMJ/MEDQ  D:  08/03/2004  T:  08/03/2004  Job:  [id number]

## 2010-10-23 NOTE — Discharge Summary (Signed)
[name], [name] NO.:  [id number]   MEDICAL RECORD NO.:  [id number]          PATIENT TYPE:  INP   LOCATION:  0380                         FACILITY:  [facility]   PHYSICIAN:  Deirdre Peer. Polite, M.D. DATE OF BIRTH:  [birth year]   DATE OF ADMISSION:  08/03/2004  DATE OF DISCHARGE:  08/08/2004                                 DISCHARGE SUMMARY   DISCHARGE DIAGNOSES:  1.  Rapid atrial fibrillation, being discharged in normal sinus rhythm after      being cardioverted.  The patient will require outpatient Coumadin and      further management by cardiology.  2.  Chronic renal insufficiency, status post transplant x 2.  3.  Hypertension.  4.  Steroid induced hyperglycemia.  5.  Recent diagnosis of bronchitis, being treated on an outpatient basis by      pulmonary with nebs.  6.  Dyslipidemia.   DISCHARGE MEDICATIONS:  1.  Coumadin 5 mg daily.  This is a new medication.  2.  Resume outpatient medications of Toprol XL 200 mg daily.  3.  CellCept 500 mg t.i.d.  4.  Prednisone 5 mg b.i.d.  5.  Fosamax.  6.  Os-Cal.  7.  Flonase.  8.  Serevent.   DISPOSITION:  The patient is being discharged to home in stable condition.   CONSULTANTS:  [facility].   STUDIES:  1.  The patient had a 2 D echocardiogram which showed an ejection fraction      of 50-55%.  Wall thickness was mildly increased.  There was moderate to      marked thickening of the mitral valve.  There was moderate to marked      mitral annular calcification.  The findings were consistent with mild      mitral stenosis.  There was mild mitral valve regurgitation __________,      mitral gradient was 6.2 mmHg, mitral valve area by pressure half-time      was 2.2-3 sq cm.  Mitral valve area by continuity equation is 1.5 sq cm.      The left atrium was moderately to markedly dilated.  2.  Chest x-ray:  Stable cardiomegaly and peribronchial thickening.  3.  CBC at discharge:  White count 8.2, hemoglobin  10.7, platelets 193.  INR      at discharge 2.4.  4.  BMET within normal limits.  Creatinine at baseline of 2.2.  5.  Lipid panel:  Total cholesterol 195, LDL 129, HDL 36.  6.  TSH within normal limits 1.2.   HISTORY OF PRESENT ILLNESS:  A 50 year old female with known history of  atrial fibrillation, chronic renal insufficiency status post transplant,  SLE, hypertension, presented to the ED with complaints of palpitation.  In  the ED, the patient was found to be in atrial fibrillation, therefore  admission was deemed necessary for further evaluation and treatment.  Please  see dictated H&P for further details.   PAST MEDICAL HISTORY:  As stated above.   ADMISSION MEDICATIONS:  Per admission H&P.   SOCIAL HISTORY:  Negative for tobacco, alcohol, or drugs.  HOSPITAL COURSE:  1.  The patient was admitted to a step-down unit for evaluation and      treatment of atrial fibrillation with rapid ventricular response.  The      patient was treated in usual fashion with rate control being achieved      with IV Cardizem.  The patient was continued on IV heparin.  A 2 D      echocardiogram was obtained to see if there was some change in wall      motion, and a TSH was ordered which was within normal limits.  The      patient was seen in consultation by [facility].  Final decision      was to perform a TEE and cardioversion.  The patient underwent TEE and      cardioversion and has been maintained in normal sinus rhythm since.  The      patient has been crossed over with Coumadin.  Currently, her INR is 2.4.      At this time, the patient is stable for discharge.  She is in normal      sinus rhythm.  Toleration of her medications.  Please note the patient      has had problems with Coumadin on an outpatient basis before.      Therefore, she will require strict follow up of her PT/INR.  The patient      will need to have her INR checked on Monday and have her medications      adjusted  as deemed necessary.  The patient had several other chronic      medical problems which are stable.  The patient will continue her      current outpatient medications.      RDP/MEDQ  D:  08/08/2004  T:  08/08/2004  Job:  [id number]

## 2010-10-23 NOTE — H&P (Signed)
NAMELOUNA, ROTHGEB NO.:  [id number]   MEDICAL RECORD NO.:  [id number]          PATIENT TYPE:  INP   LOCATION:  0152                         [facility]ACILITY:  [facility]   PHYSICIAN:  Arvilla Meres, M.D. [facility] OF BIRTH:  08-21-1960   DATE OF ADMISSION:  08/03/2004  DATE OF DISCHARGE:                                HISTORY & PHYSICAL   PHYSICIANS:  Cardiologist:  Pricilla Riffle, M.D.  Primary care physician:  Wilber Bihari. Caryn Section, M.D.   CHIEF COMPLAINT:  Atrial fibrillation.  Asked to consult by patient's  attending, Dr. Nehemiah Settle, for atrial fibrillation.   HISTORY OF PRESENT ILLNESS:  This is a 50 year old Caucasian female first  diagnosed with atrial fibrillation in February 2005, was seen by Dr. Dietrich Pates at that time.  She was anticoagulated status post cardioversion, though  normal sinus rhythm, no further atrial fibrillation since then.  Yesterday,  however, the patient noticed after an acute episode of coughing which was  very forceful, that her heart flipped.  She states it began pounding after  that.  The patient also states she has had a cold, has been very symptomatic  with her cold since May 22, 2004.  Has been seen by her primary care  physician and has been placed on several different medications including  Advair, doxycycline, Tussionex, Benadryl for relief of her cold.  She denies  any over-the-counter medications with Sudafed.  She was diagnosed with post  viral cough.  She has a pending CMV lab work.  She denies any chest pain,  shortness of breath, dyspnea on exertion.  She denies any lightheadedness  and dizziness with the atrial fibrillation.   ALLERGIES:  COMPAZINE.   MEDICATIONS AT HOME:  1.  Aspirin.  2.  Cardizem 120.  3.  Toprol XL 200.  4.  CellCept 500 t.i.d.  5.  Prednisone 5 b.i.d.  6.  Serevent.  7.  Flonase.   PAST MEDICAL HISTORY:  1.  Atrial fibrillation status post DC cardioversion to sinus rhythm in      February 2005.  2.  Recent bronchitis with chronic cough.  3.  Anticoagulation therapy with Coumadin; however, the patient discontinued      herself in 2005 secondary to difficulty maintaining a therapeutic INR.  4.  Hypertension.  5.  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  6.  Status post renal transplant x 2.  7.  Last echocardiogram was in February 2005.  The study was inadequate for      evaluation of left ventricular regional wall motion.  Overall, the LV      function is probably normal.  There was significant mitral annular      calcification.  8.  Hyperglycemia secondary to steroids.   SOCIAL HISTORY:  The patient lives here in [place] with her husband.  She  is a Occupational psychologist for [employer].  She does not  have any children.  Denies any ETOH, tobacco, caffeine, drug or herbal  medication use.   FAMILY HISTORY:  Negative for coronary artery disease.   REVIEW OF SYSTEMS:  HEENT:  Positive for nasal discharge.  CARDIOPULMONARY:  Positive for palpitations, cough, and occasional wheezing.  The patient  denies all other Review of Systems.   PHYSICAL EXAMINATION:  VITAL SIGNS:  Temperature 97.7, pulse 88 to 92,  respirations 22, blood pressure 112/54, weight 217.6.  She is saturating 93%  on 2 liters.  GENERAL:  Alert and oriented in no acute distress.  Very pleasant and  cooperative.  HEENT:  Pupils equal, round, and reactive to light.  NECK:  Supple without lymphadenopathy.  Negative bruit, negative JVD.  CARDIOVASCULAR:  S1 and S2, irregular.  LUNGS:  Clear to auscultation bilaterally.  EXTREMITIES:  Trace edema in lower legs.  NEUROLOGIC:  Alert and oriented x 3.   LABORATORY DATA AND OTHER STUDIES:  Chest x-ray pending.   EKG from yesterday shows rate of 155, atrial fibrillation, QRS 80.   White blood cells 10.9, hemoglobin 11.4, hematocrit 33.9, platelets 200,000.  Sodium 136, potassium 4.3, BUN 21, creatinine 2.2, glucose 124.   Dr. Arvilla Meres to see patient, examined.   IMPRESSION:  A 50 year old female with lupus status post kidney transplant x  2, second episode of atrial fibrillation, started on Coumadin but stopped  secondary to difficulty regulating, now with recurrence in setting of upper  respiratory infection.  Remains symptomatic despite rate control.   PLAN:  Will plan for TEE cardioversion on March 1 if we have spot available.  Given increased creatinine, antiarrhythmic options somewhat limited.  Suspect may need one in future; however as recurrence is fairly rare, will  not start at this time.  We will check an echocardiogram and TSH level,  continue heparin, start patient on Coumadin.  We can switch patient over to  p.o. Cardizem.  Patient agreeable to trying Coumadin again.  Will monitor  very closely.      MB/MEDQ  D:  08/04/2004  T:  08/04/2004  Job:  [id number]   cc:   Wilber Bihari. Caryn Section, M.D.  [address]  [place]  [place] [address]  Fax: [phone]

## 2010-10-23 NOTE — Cardiovascular Report (Signed)
NAMEROSMERY, [name] NO.:  [id number]   MEDICAL RECORD NO.:  [id number]          PATIENT TYPE:  OUT   LOCATION:  ENDO                         FACILITY:  [facility]   PHYSICIAN:  Olga Millers, M.D. LHCDATE OF BIRTH:  1960-12-06   DATE OF PROCEDURE:  08/05/2004  DATE OF DISCHARGE:                              CARDIAC CATHETERIZATION   This is cardioversion of atrial fibrillation.  The patient was sedated by  anesthesia with pentothal 125 mg intravenously.  Synchronized cardioversion  (biphasic) with 120 joules resulted in normal sinus rhythm.  There were no  immediate complications.  We would recommend continuing heparin until her  INR is therapeutic.      BC/MEDQ  D:  08/05/2004  T:  08/05/2004  Job:  [id number]

## 2010-10-23 NOTE — Discharge Summary (Signed)
[name], [name] NO.:  [id number]   MEDICAL RECORD NO.:  [id number]          PATIENT TYPE:  INP   LOCATION:  2910                         FACILITY:  [facility]   PHYSICIAN:  Arvilla Meres, M.D. LHCDATE OF BIRTH:  09/09/1960   DATE OF ADMISSION:  08/10/2004  DATE OF DISCHARGE:  08/13/2004                           DISCHARGE SUMMARY - REFERRING   PROCEDURE:  DC cardioversion August 13, 2004.   REASON FOR ADMISSION:  Please refer to dictated admission note.   LABORATORY DATA:  INR 5.4 at discharge (preceded by 5.8, 3.8 on admission).  Potassium 4.1, BUN 44, creatinine 2.3 at discharge (creatinine 2.1 on  admission).   HOSPITAL COURSE:  Patient was admitted by Dr. Nicholes Mango for management  of recurrent atrial fibrillation in the context of recent DC cardioversion,  and was placed on IV Diltiazem for rate control.   Following consultation with Dr. Callie Fielding at [facility], regarding possible  ablation, recommendation was to proceed with antiarrhythmic therapy and  patient was started on flecainide with plans to repeat  DC cardioversion.   INR was therapeutic with a level of 3.8 on admission (5.4 at discharge).  Dr. Gala Romney preceded with successful DC cardioversion on hospital day #3,  resulting in normal sinus rhythm wherein patient remained at time of  discharge later that afternoon.   Final medication recommendations, per Dr. Gala Romney, at time of discharge:  Down titration of Toprol to 100 daily, given initiation of flecainide.  Home  dose of Cardizem (120 daily) will be continued.   Of note, the patient's blood pressure was 100/70 at discharge; telemetry  revealed NSR in the mid 70s.   Regarding the elevated INR, patient was instructed to hold Coumadin and to  resume on Sunday, August 16, 2004, at home dose of 5 mg daily.   DISCHARGE MEDICATIONS:  1.  Flecainide 100 mg b.i.d. (new).  2.  Protonix 40 mg daily (new).  3.  Toprol XL 100 mg daily.  4.   Cardizem LA 120 mg daily.  5.  Coumadin 5 mg as directed.  6.  Prednisone 5 mg b.i.d.  7.  CellCept 500 mg t.i.d.  8.  Fosamax 5 mg daily.  9.  Os-Cal one tablet daily.  10. Enalapril 2.5 mg daily.   DISCHARGE INSTRUCTIONS:  1.  Hold Coumadin until Sunday, August 16, 2004, then resume at 5 mg daily.  2.  Follow-up protime Monday, August 17, 2004, at [facility].  3.  Follow-up with Dr. Dan Bensimhon on Tuesday, August 25, 2004, at 3:30      p.m.  4.  Follow-up with Dr. Pat Hranitzky ([facility]) - arrangements to be made per      Dr. Bensimhon.   DISCHARGE DIAGNOSES:  1.  Recurrent atrial fibrillation with rapid ventricular response.      1.  Status post successful direct current cardioversion August 13, 2004.      2.  Flecainide load initiated.      3.  Status post transesophageal echocardiogram cardioversion March 1,          20 06.  2.  Chronic  Coumadin.  3.  Chronic renal insufficiency.      1.  Secondary to lupus nephritis.      2.  Status post renal transplant (x2).  4.  Preserved left ventricular function.      1.  Mild mitral stenosis.  5.  History of hypertension.  6.  History of dyslipidemia.  7.  Hyperglycemia.      1.  Steroid induced.      GS/MEDQ  D:  08/13/2004  T:  08/13/2004  Job:  [id number]   cc:   Wilber Bihari. Caryn Section, M.D.  [address]  [place]  [place] [address]  Fax: [phone]   Callie Fielding, M.D.  [facility]

## 2010-10-23 NOTE — Cardiovascular Report (Signed)
[name], [name]                         ACCOUNT NO.:  [id number]   MEDICAL RECORD NO.:  [id number]                   PATIENT TYPE:  INP   LOCATION:  4741                                 FACILITY:  [facility]   PHYSICIAN:  Charlton Haws, M.D.                  DATE OF BIRTH:  17-Nov-1960   DATE OF PROCEDURE:  07/16/2003  DATE OF DISCHARGE:                              CARDIAC CATHETERIZATION   PROCEDURE:  Cardioversion.   CARDIOLOGIST:  Charlton Haws, M.D.   INDICATIONS:  Atrial fibrillation.   DESCRIPTION OF PROCEDURE:  The patient was treated with 6 mg of Versed and  12.5 mg of Phenergan.   Using good chest technique an Omniplane probe was advanced to the distal  esophagus without incident.  There was moderate LVH.  Ejection fraction was  normal.  The mitral annulus was significantly calcified with mild mitral  stenosis.  The mean gradient was 6.5 mmHg.  There was trivial MR.  The  aortic valve was trileaflet and sclerotic.  Right sided cardiac chambers  were normal.  The left atrial appendage was dilated.  There was mild to  moderate spontaneous contrast but no formed thrombus.  Imaging in the aorta  showed no significant debris.   IMPRESSION:  1. No left atrial appendage thrombus.  2. Moderate spontaneous contrast.   PROCEDURE:  Direct current cardioversion:  The patient was anesthetized with  300 mg of sodium Pentothal.  Using a biphasic defibrillator, a single 200  joule shock was delivered.  The patient converted from atrial fibrillation  at a rate of 130 to normal sinus rhythm at a rate of 75.  Hemodynamics were  stable.  At the end of the case, the blood pressure was 145/85, and the  patient was breathing spontaneously with no signs of stroke.  The INR was  therapeutic at 2.6 during the procedure.                                               Charlton Haws, M.D.    PN/MEDQ  D:  07/16/2003  T:  07/17/2003  Job:  [id number]
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
## 2010-10-23 NOTE — H&P (Signed)
[name], SPEEDY NO.:  [id number]   MEDICAL RECORD NO.:  [id number]          PATIENT TYPE:  INP   LOCATION:                               FACILITY:  [facility]   PHYSICIAN:  Arvilla Meres, M.D. LHCDATE OF BIRTH:  12/04/1960   DATE OF ADMISSION:  08/10/2004  DATE OF DISCHARGE:                                HISTORY & PHYSICAL   Nephrologist is Wilber Bihari. Caryn Section, M.D.   PATIENT IDENTIFICATION:  <[name] is a very pleasant 50 year old woman  with history of lupus nephritis, status post renal transplant x2, paroxysmal  atrial fibrillation, who presents to the office with recurrent atrial  fibrillation status post cardioversion several days ago.   HISTORY OF PRESENT ILLNESS:  I recently met [name] while she was in the  hospital last week for recurrent paroxysmal atrial fibrillation.  According  to the records, she was diagnosed with atrial fibrillation in February 2005.  At that time she underwent cardioversion and was anticoagulated.  From a  cardiovascular standpoint, she had been doing quite well until February 27,  when she developed palpitations in the setting of an upper respiratory tract  infection.  She was admitted to the [facility] ICU and was placed on IV  Cardizem drip due to her rapid ventricular response.  Throughout the  hospitalization, her rate was somewhat difficult to control and she remained  symptomatic even once her heart rate was under 100.  Thus, on August 05, 2004,  she underwent TEE-guided cardioversion to restore normal sinus rhythm.  She  was anticoagulated and discharged home.   She called the office this morning and said last night she felt recurrent  palpitations.  She was instructed to come to the office and once again was  found to be in atrial fibrillation with rapid ventricular response despite  fairly significant doses of AV nodal blockers.  She does feel mildly  uncomfortable with the palpitations but denies any significant  chest pain or  shortness of breath.   PAST MEDICAL HISTORY:  1.  Paroxysmal atrial fibrillation.      1.  Status post TEE cardioversion on August 05, 2004.      2.  Status post previous cardioversion in February 2005.      3.  Echocardiogram on August 05, 2004, showed an EF of 50-55%; however,          there were poor windows and there was significant  mitral annular          calcifications with mild mitral stenosis.  I do not have the results          of her TEE to further quantify her mitral stenosis.  2.  History of lupus nephritis, status post renal transplant x2, followed by      Dr. Marina Gravel.  3.  Chronic renal insufficiency secondary to #2 above, baseline creatinine      approximately 2.0.  4.  Hypertension.  5.  Hyperglycemia, steroid-induced.  6.  Hyperlipidemia.   CURRENT MEDICATIONS:  1.  Coumadin 5 mg a day.  2.  Toprol XL  200 mg a day.  3.  CellCept 500 mg t.i.d.  4.  Prednisone 5 mg b.i.d.  5.  Fosamax 5 mg daily.  6.  Os-Cal one tablet daily.  7.  Flonase p.r.n.  8.  Serevent p.r.n.  9.  Cardizem LA 120 mg a day.  10. Enalapril 2.5 mg daily.   MEDICATION ALLERGIES:  COMPAZINE.   SOCIAL HISTORY:  She lives in [place] with her husband.  She is a  Occupational psychologist for [employer].  She does not have any  children.  She denies any alcohol, tobacco or caffeine use.   FAMILY HISTORY:  Negative for coronary artery disease.   PHYSICAL EXAMINATION:  GENERAL:  She is pleasant, in no acute distress.  VITAL SIGNS:  Blood pressure is 130/80, heart rate is tachycardic, about 140  and irregular.  HEENT:  Sclerae anicteric, EOMI.  NECK:  Supple, there is no JVD.  Carotids are 2+ bilaterally without any  bruits.  There is no lymphadenopathy or thyromegaly.  CARDIAC:  She is tachycardic and regular.  There is a soft systolic ejection  murmur at the left sternal border.  CHEST:  Lungs are clear to auscultation bilaterally.  ABDOMEN:  Soft, nontender,  nondistended.  EXTREMITIES:  Warm with no cyanosis, clubbing or edema.  NEUROLOGIC:  She is alert and oriented x3 and otherwise nonfocal.   EKG shows atrial fibrillation with a rapid ventricular response at 138 beats  per minute.   ASSESSMENT AND PLAN:  Ms. Riches now has recurrent atrial fibrillation,  status post recent cardioversion.  She is fairly symptomatic with this and  remains tachycardic despite significant doses of AV nodal blockers.  Thus,  at this point I do not think she is an adequate candidate for rate control  and will need to consider options regarding cardioversion.  Given her renal  insufficiency, she would not be a candidate for sotalol or Tikosyn, and thus  we are left with flecainide or amiodarone. With her age, I am reluctant to  start amiodarone and thus flecainide would be our main choice.  I have  discussed these options with Ms. Bratcher as well as the option of ablation  with pulmonary vein isolation.  Currently she would favor ablation for her  atrial fibrillation.  I have discussed this with Duke Salvia, M.D., and  also attempted to get in touch with Dr. Callie Fielding at [facility], who performs  the ablations.  The question would be whether or not she would be a  candidate for ablation without previous exposure to antiarrhythmic therapy.   The plan will be now to admit her to the hospital for improved rate control  on a Cardizem drip.  I will then discuss with Dr. Deno Lunger whether or not  we should go ahead and initiate flecainide or consider transfer to [facility] for  possible ablation.  Will also have to look at her TEE to further quantify  her degree of mitral stenosis.  She will need to remain anticoagulated,  given her hypertension and risk of stroke.      DB/MEDQ  D:  08/10/2004  T:  08/10/2004  Job:  [id number]   cc:   Wilber Bihari. Caryn Section, M.D.  [address]  [place]  [place] [address]  Fax: [phone]

## 2010-10-23 NOTE — Discharge Summary (Signed)
[name], [name]                         ACCOUNT NO.:  [id number]   MEDICAL RECORD NO.:  [id number]                   PATIENT TYPE:  INP   LOCATION:  4741                                 FACILITY:  [facility]   PHYSICIAN:  [name], M.D.                  DATE OF BIRTH:  30-May-1961   DATE OF ADMISSION:  07/11/2003  DATE OF DISCHARGE:  07/17/2003                                 DISCHARGE SUMMARY   DISCHARGE DIAGNOSES:  1. New onset atrial fibrillation with a rapid ventricular response.  2. Systemic lupus erythematosus.  3. Status post cadaveric renal transplant in 1989 on the right side.  4. Status post living unrelated renal transplant, 2001, on the left side, at     [facility].   DISCHARGE MEDICATIONS:  1. Prednisone 5 mg p.o. b.i.d.  2. CellCept 500 mg p.o. t.i.d.  3. Nexium 40 mg p.o. q.d.  4. Oscal with vitamin D p.o. t.i.d.  5. Fosamax 70 mg p.o. weekly.  6. Toprol XL 200 mg p.o. q.d.  7. Cardizem CD 120 mg p.o. q.d.  8. Coumadin 1 mg p.o. q.d.   CONSULTS:  [facility], Dr. Tenny Craw.   BRIEF ADMISSION HISTORY:  This is a 50 year old white female with a history  of SLE status post renal transplant x 2 who presented with new onset atrial  fibrillation at [facility] on the morning of July 11, 2003.  She stated she began feeling poorly approximately one week prior to  her admission, with palpitations and significant dyspnea upon exertion.  On  Monday, July 08, 2003, she had presented to [facility]  and was found to be orthostatic with a creatinine of 2.0.  Her baselines are  around 1.6 to 1.8.  Prednisone was increased to 10 mg p.o. b.i.d., and her  Lopressor was stopped.  She returned on July 11, 2003, for labs, and  complained of her heart racing.  EKG at that time revealed atrial  fibrillation.  Consequently, [name] was admitted for rate control.   BRIEF HOSPITAL COURSE:  1. Atrial fibrillation with a  rapid ventricular response.  [name] was     admitted with a history as outlined above.  A cardiology consult was     requested upon admission.  She had received Lopressor 50 mg p.o. x 1     prior to arriving at the hospital.  Upon arrival, though, her heart rate     was still in the 130s to 150s in atrial fibrillation.  IV Cardizem drip     was instituted prior to cardiology's arrival.  They agreed with the     Cardizem drip, and started [name] on Lovenox and Coumadin.  Serial     cardiac enzymes were obtained, which were negative.  A TSH level was also     checked, which was within normal limits at  2.774.  Over the next few     days, [name] was slowly titrated with increasing amounts of her     Lopressor, without obtaining rate control.  Consequently, on July 15, 2003, it was determined that a transesophageal echocardiogram and direct     cardioversion would be the best option for [name].  Consequently, she     underwent this procedure on July 16, 2003, and was converted into     normal sinus rhythm after one shock at 200 joules.  She was further     monitored overnight, and maintained a normal sinus rhythm.  Of note,     while [name] was in the hospital, her INR rose very quickly on     Coumadin 7.5 mg p.o. q.d., up to 4.5.  Consequently, we held a dose, and     resumed her at the lower dose of 1 mg p.o. q.d.  Her INR remained in the     therapeutic region on that dose, and was 2.5 on the day of discharge.  2. Systemic lupus erythematosus, stable on prednisone.  3. Status post renal transplant x 2, stable on CellCept and prednisone.   LABORATORY DATA:  Creatinine ranged from 1.8 to 1.9 while in the hospital.  Serial CKs were 39, 39 and 28.  CK-MBs were 1.3, 1.2 and 0.8.  Troponin I's  were 0.05, 0.04, and 0.03.  A TSH level on July 11, 2003, was 2.774.  Serum iron was 31, TIBC 224, and percent sat of 14.  Parathyroid hormone  level was pending at the time of  discharge.   STUDIES:  1. A 2-D echocardiogram, which was labeled inadequate for evaluation of left     ventricular region wall motion.  There was marked mitral annular     calcification.  The left atrium was dilated.  There did appear to be a     small pericardial effusion.  2. Transesophageal echocardiogram on July 16, 2003, negative for any left     atrial thrombus.   ACTIVITY:  [name] is allowed to return to work on Monday, July 22, 2003.   DIET:  A low-salt diet.   SPECIAL INSTRUCTIONS:  [name] was advised to call if there are any  concerns with a rapid heartbeat, significant fatigue, or other concerns.   FOLLOW-UP:  1. Dr. Caryn Section.  Please follow up as usual with Dr. Caryn Section at [facility].  2. Dr. Tenny Craw, [facility], at [phone], [address] on     the third floor.  She has an appointment on August 05, 2003, at 4:00     p.m.  3. Coumadin clinic at [facility], first appointment on Friday,     July 19, 2003, at 11:45 a.m., with follow-up appointment on Thursday,     July 25, 2003, at 9:30 a.m.                                                [name], M.D.    AK/MEDQ  D:  07/17/2003  T:  07/17/2003  Job:  [id number]   cc:   Wilber Bihari. Caryn Section, M.D.  [address]  [place]  [place] [address]  Fax: [phone]   Pricilla Riffle, M.D.

## 2010-10-23 NOTE — Op Note (Signed)
[name], [name] NO.:  [id number]   MEDICAL RECORD NO.:  [id number]          PATIENT TYPE:  OIB   LOCATION:  2899                         FACILITY:  [facility]   PHYSICIAN:  Arvilla Meres, M.D. LHCDATE OF BIRTH:  Mar 29, 1961   DATE OF PROCEDURE:  10/16/2004  DATE OF DISCHARGE:  10/16/2004                                 OPERATIVE REPORT   PROCEDURE PERFORMED:  Cardioversion.   PATIENT IDENTIFICATION:  This is a delightful, 50 year old woman with a  history of lupus nephritis, who I saw yesterday in the office with a  recurrent atrial fibrillation.  She has been managed with Flecainide after  several previous cardioversions.  This is her first relapse after initiating  Flecainide therapy.  She presented yesterday to the office with recurrent  palpitations and was found to be in A-fib with RVR at a rate of 127.  She  was brought in today for elective cardioversion.  She has been followed in  our Coumadin Clinic and all INRs have been therapeutic for greater than six  weeks.   DESCRIPTION OF PROCEDURE:  The risks and benefits of the procedure were  discussed with [name].  A consent was signed and placed on the chart.  Anesthesia was present for a conscious sedation.  Pads were placed in the  anterior and posterior positions.  She was in A-fib at the time of the  procedure.  After sedation, she was cardioverted with a single 200-joule  synchronized shock back to normal sinus rhythm in the 60s.  There were no  apparent complications.   The plan now will be to increase her Flecainide to 150 mg b.i.d.  However,  she was unsure if she actually took her Flecainide dose last night and,  thus, although she had a trough level drawn this morning it may not be  accurate.  We will now hold off in increasing her Flecainide until we can  perform the trough check on Monday morning in the office.   ASSESSMENT AND PLAN:  1.  Successful cardioversion of atrial fibrillation  back to sinus rhythm.  2.  Continue the Flecainide at 100 mg b.i.d.  Will check trough dosing on      Monday.  If the trough level is on the low end of the therapeutic range,      we will increase her dosing to 150 mg b.i.d.      DB/MEDQ  D:  10/16/2004  T:  10/17/2004  Job:  [id number]

## 2010-10-24 ENCOUNTER — Other Ambulatory Visit (HOSPITAL_COMMUNITY): Payer: BC Managed Care – PPO | Attending: Internal Medicine

## 2010-10-24 LAB — DIGOXIN LEVEL: Digoxin Level: 1.9 ng/mL (ref 0.8–2.0)

## 2010-10-24 LAB — BASIC METABOLIC PANEL
CO2: 21 mEq/L (ref 19–32)
Chloride: 104 mEq/L (ref 96–112)
GFR calc Af Amer: 17 mL/min — ABNORMAL LOW (ref >60)
Sodium: 140 mEq/L (ref 135–145)

## 2010-10-24 LAB — CBC
Hemoglobin: 9.1 g/dL — ABNORMAL LOW (ref 12.0–15.0)
MCH: 30 pg (ref 26.0–34.0)
RBC: 3.03 MIL/uL — ABNORMAL LOW (ref 3.87–5.11)

## 2010-10-24 LAB — BLOOD GAS, ARTERIAL
Patient temperature: 98.6
pH, Arterial: 7.405 — ABNORMAL HIGH (ref 7.350–7.400)

## 2010-10-24 LAB — PTH-RELATED PEPTIDE

## 2010-10-25 ENCOUNTER — Inpatient Hospital Stay (HOSPITAL_COMMUNITY): Payer: BC Managed Care – PPO

## 2010-10-25 ENCOUNTER — Emergency Department (HOSPITAL_COMMUNITY): Payer: BC Managed Care – PPO

## 2010-10-25 ENCOUNTER — Other Ambulatory Visit (HOSPITAL_COMMUNITY): Payer: BC Managed Care – PPO

## 2010-10-25 ENCOUNTER — Inpatient Hospital Stay (HOSPITAL_COMMUNITY)
Admission: EM | Admit: 2010-10-25 | Discharge: 2010-11-25 | DRG: 531 | Disposition: A | Discharge status: Other Acute Inpatient Hospital | Payer: BC Managed Care – PPO | Attending: Internal Medicine | Admitting: Internal Medicine

## 2010-10-25 ENCOUNTER — Other Ambulatory Visit (HOSPITAL_COMMUNITY): Payer: BC Managed Care – PPO | Attending: Internal Medicine

## 2010-10-25 DIAGNOSIS — T45515A Adverse effect of anticoagulants, initial encounter: Secondary | ICD-10-CM | POA: Diagnosis present

## 2010-10-25 DIAGNOSIS — B965 Pseudomonas (aeruginosa) (mallei) (pseudomallei) as the cause of diseases classified elsewhere: Secondary | ICD-10-CM | POA: Diagnosis not present

## 2010-10-25 DIAGNOSIS — I129 Hypertensive chronic kidney disease with stage 1 through stage 4 chronic kidney disease, or unspecified chronic kidney disease: Secondary | ICD-10-CM | POA: Diagnosis present

## 2010-10-25 DIAGNOSIS — Z86718 Personal history of other venous thrombosis and embolism: Secondary | ICD-10-CM

## 2010-10-25 DIAGNOSIS — D696 Thrombocytopenia, unspecified: Secondary | ICD-10-CM | POA: Diagnosis present

## 2010-10-25 DIAGNOSIS — N179 Acute kidney failure, unspecified: Secondary | ICD-10-CM | POA: Diagnosis present

## 2010-10-25 DIAGNOSIS — D500 Iron deficiency anemia secondary to blood loss (chronic): Secondary | ICD-10-CM | POA: Diagnosis present

## 2010-10-25 DIAGNOSIS — E8809 Other disorders of plasma-protein metabolism, not elsewhere classified: Secondary | ICD-10-CM | POA: Diagnosis present

## 2010-10-25 DIAGNOSIS — I5022 Chronic systolic (congestive) heart failure: Secondary | ICD-10-CM | POA: Diagnosis present

## 2010-10-25 DIAGNOSIS — I4892 Unspecified atrial flutter: Secondary | ICD-10-CM | POA: Diagnosis present

## 2010-10-25 DIAGNOSIS — A0472 Enterocolitis due to Clostridium difficile, not specified as recurrent: Secondary | ICD-10-CM | POA: Diagnosis not present

## 2010-10-25 DIAGNOSIS — N390 Urinary tract infection, site not specified: Secondary | ICD-10-CM | POA: Diagnosis not present

## 2010-10-25 DIAGNOSIS — I509 Heart failure, unspecified: Secondary | ICD-10-CM | POA: Diagnosis present

## 2010-10-25 DIAGNOSIS — M329 Systemic lupus erythematosus, unspecified: Secondary | ICD-10-CM | POA: Diagnosis present

## 2010-10-25 DIAGNOSIS — L89899 Pressure ulcer of other site, unspecified stage: Secondary | ICD-10-CM | POA: Diagnosis present

## 2010-10-25 DIAGNOSIS — E039 Hypothyroidism, unspecified: Secondary | ICD-10-CM | POA: Diagnosis present

## 2010-10-25 DIAGNOSIS — Y830 Surgical operation with transplant of whole organ as the cause of abnormal reaction of the patient, or of later complication, without mention of misadventure at the time of the procedure: Secondary | ICD-10-CM | POA: Diagnosis present

## 2010-10-25 DIAGNOSIS — Z794 Long term (current) use of insulin: Secondary | ICD-10-CM

## 2010-10-25 DIAGNOSIS — D62 Acute posthemorrhagic anemia: Secondary | ICD-10-CM | POA: Diagnosis not present

## 2010-10-25 DIAGNOSIS — J9819 Other pulmonary collapse: Secondary | ICD-10-CM | POA: Diagnosis present

## 2010-10-25 DIAGNOSIS — K5900 Constipation, unspecified: Secondary | ICD-10-CM | POA: Diagnosis not present

## 2010-10-25 DIAGNOSIS — E872 Acidosis, unspecified: Secondary | ICD-10-CM | POA: Diagnosis present

## 2010-10-25 DIAGNOSIS — G929 Unspecified toxic encephalopathy: Principal | ICD-10-CM | POA: Diagnosis present

## 2010-10-25 DIAGNOSIS — K661 Hemoperitoneum: Secondary | ICD-10-CM | POA: Diagnosis present

## 2010-10-25 DIAGNOSIS — Y9210 Unspecified residential institution as the place of occurrence of the external cause: Secondary | ICD-10-CM | POA: Diagnosis present

## 2010-10-25 DIAGNOSIS — I82409 Acute embolism and thrombosis of unspecified deep veins of unspecified lower extremity: Secondary | ICD-10-CM | POA: Diagnosis present

## 2010-10-25 DIAGNOSIS — D638 Anemia in other chronic diseases classified elsewhere: Secondary | ICD-10-CM | POA: Diagnosis present

## 2010-10-25 DIAGNOSIS — T424X5A Adverse effect of benzodiazepines, initial encounter: Secondary | ICD-10-CM | POA: Diagnosis present

## 2010-10-25 DIAGNOSIS — L8990 Pressure ulcer of unspecified site, unspecified stage: Secondary | ICD-10-CM | POA: Diagnosis present

## 2010-10-25 DIAGNOSIS — T8610 Unspecified complication of kidney transplant: Secondary | ICD-10-CM | POA: Diagnosis present

## 2010-10-25 DIAGNOSIS — G0481 Other encephalitis and encephalomyelitis: Secondary | ICD-10-CM | POA: Diagnosis present

## 2010-10-25 DIAGNOSIS — R58 Hemorrhage, not elsewhere classified: Secondary | ICD-10-CM | POA: Diagnosis present

## 2010-10-25 DIAGNOSIS — IMO0002 Reserved for concepts with insufficient information to code with codable children: Secondary | ICD-10-CM

## 2010-10-25 DIAGNOSIS — D72829 Elevated white blood cell count, unspecified: Secondary | ICD-10-CM | POA: Diagnosis present

## 2010-10-25 DIAGNOSIS — G92 Toxic encephalopathy: Principal | ICD-10-CM | POA: Diagnosis present

## 2010-10-25 DIAGNOSIS — E119 Type 2 diabetes mellitus without complications: Secondary | ICD-10-CM | POA: Diagnosis not present

## 2010-10-25 DIAGNOSIS — Z882 Allergy status to sulfonamides status: Secondary | ICD-10-CM

## 2010-10-25 DIAGNOSIS — N184 Chronic kidney disease, stage 4 (severe): Secondary | ICD-10-CM | POA: Diagnosis present

## 2010-10-25 DIAGNOSIS — E46 Unspecified protein-calorie malnutrition: Secondary | ICD-10-CM | POA: Diagnosis present

## 2010-10-25 DIAGNOSIS — J90 Pleural effusion, not elsewhere classified: Secondary | ICD-10-CM | POA: Diagnosis present

## 2010-10-25 DIAGNOSIS — J9620 Acute and chronic respiratory failure, unspecified whether with hypoxia or hypercapnia: Secondary | ICD-10-CM | POA: Diagnosis not present

## 2010-10-25 DIAGNOSIS — J189 Pneumonia, unspecified organism: Secondary | ICD-10-CM | POA: Diagnosis not present

## 2010-10-25 DIAGNOSIS — I4891 Unspecified atrial fibrillation: Secondary | ICD-10-CM | POA: Diagnosis present

## 2010-10-25 DIAGNOSIS — T380X5A Adverse effect of glucocorticoids and synthetic analogues, initial encounter: Secondary | ICD-10-CM | POA: Diagnosis present

## 2010-10-25 DIAGNOSIS — E876 Hypokalemia: Secondary | ICD-10-CM | POA: Diagnosis not present

## 2010-10-25 DIAGNOSIS — B370 Candidal stomatitis: Secondary | ICD-10-CM | POA: Diagnosis not present

## 2010-10-25 LAB — DIFFERENTIAL
Basophils Absolute: 0 10*3/uL (ref 0.0–0.1)
Lymphocytes Relative: 4 % — ABNORMAL LOW (ref 12–46)
Lymphs Abs: 0.6 10*3/uL — ABNORMAL LOW (ref 0.7–4.0)
Monocytes Relative: 8 % (ref 3–12)

## 2010-10-25 LAB — COMPREHENSIVE METABOLIC PANEL
ALT: 12 U/L (ref 0–35)
AST: 10 U/L (ref 0–37)
Albumin: 2.2 g/dL — ABNORMAL LOW (ref 3.5–5.2)
CO2: 22 mEq/L (ref 19–32)
Calcium: 9.6 mg/dL (ref 8.4–10.5)
GFR calc Af Amer: 16 mL/min — ABNORMAL LOW (ref >60)
GFR calc non Af Amer: 14 mL/min — ABNORMAL LOW (ref >60)
Sodium: 143 mEq/L (ref 135–145)

## 2010-10-25 LAB — POCT CARDIAC MARKERS
CKMB, poc: <1 ng/mL — ABNORMAL LOW (ref 1.0–8.0)
Troponin i, poc: <0.05 ng/mL (ref 0.00–0.09)

## 2010-10-25 LAB — CBC
Hemoglobin: 9.2 g/dL — ABNORMAL LOW (ref 12.0–15.0)
MCHC: 31.4 g/dL (ref 30.0–36.0)
RBC: 3.08 MIL/uL — ABNORMAL LOW (ref 3.87–5.11)

## 2010-10-26 ENCOUNTER — Inpatient Hospital Stay (HOSPITAL_COMMUNITY): Payer: BC Managed Care – PPO

## 2010-10-26 LAB — URINALYSIS, ROUTINE W REFLEX MICROSCOPIC
Bilirubin Urine: NEGATIVE
Glucose, UA: NEGATIVE mg/dL
Specific Gravity, Urine: 1.019 (ref 1.005–1.030)
Urobilinogen, UA: 0.2 mg/dL (ref 0.0–1.0)
pH: 5.5 (ref 5.0–8.0)

## 2010-10-26 LAB — URINE MICROSCOPIC-ADD ON

## 2010-10-26 LAB — CBC
HCT: 28.5 % — ABNORMAL LOW (ref 36.0–46.0)
Hemoglobin: 8.8 g/dL — ABNORMAL LOW (ref 12.0–15.0)
MCHC: 30.9 g/dL (ref 30.0–36.0)
MCV: 95 fL (ref 78.0–100.0)
RDW: 18.3 % — ABNORMAL HIGH (ref 11.5–15.5)

## 2010-10-26 LAB — COMPREHENSIVE METABOLIC PANEL
Alkaline Phosphatase: 175 U/L — ABNORMAL HIGH (ref 39–117)
BUN: 97 mg/dL — ABNORMAL HIGH (ref 6–23)
CO2: 22 mEq/L (ref 19–32)
Calcium: 9.7 mg/dL (ref 8.4–10.5)
GFR calc non Af Amer: 13 mL/min — ABNORMAL LOW (ref >60)
Glucose, Bld: 88 mg/dL (ref 70–99)
Potassium: 3.4 mEq/L — ABNORMAL LOW (ref 3.5–5.1)
Total Protein: 4.9 g/dL — ABNORMAL LOW (ref 6.0–8.3)

## 2010-10-26 LAB — GLUCOSE, CAPILLARY
Glucose-Capillary: 106 mg/dL — ABNORMAL HIGH (ref 70–99)
Glucose-Capillary: 127 mg/dL — ABNORMAL HIGH (ref 70–99)

## 2010-10-26 LAB — MAGNESIUM: Magnesium: 1.8 mg/dL (ref 1.5–2.5)

## 2010-10-26 LAB — DIFFERENTIAL
Basophils Relative: 0 % (ref 0–1)
Eosinophils Relative: 0 % (ref 0–5)
Lymphs Abs: 0.7 10*3/uL (ref 0.7–4.0)
Monocytes Absolute: 0.9 10*3/uL (ref 0.1–1.0)

## 2010-10-26 LAB — PHOSPHORUS: Phosphorus: 4.8 mg/dL — ABNORMAL HIGH (ref 2.3–4.6)

## 2010-10-26 LAB — PROTIME-INR: Prothrombin Time: 18.9 seconds — ABNORMAL HIGH (ref 11.6–15.2)

## 2010-10-27 DIAGNOSIS — I4891 Unspecified atrial fibrillation: Secondary | ICD-10-CM

## 2010-10-27 LAB — CBC
HCT: 27.3 % — ABNORMAL LOW (ref 36.0–46.0)
Hemoglobin: 8.8 g/dL — ABNORMAL LOW (ref 12.0–15.0)
MCH: 30.4 pg (ref 26.0–34.0)
MCHC: 32.2 g/dL (ref 30.0–36.0)
MCV: 94.5 fL (ref 78.0–100.0)
RBC: 2.89 MIL/uL — ABNORMAL LOW (ref 3.87–5.11)

## 2010-10-27 LAB — RENAL FUNCTION PANEL
BUN: 94 mg/dL — ABNORMAL HIGH (ref 6–23)
CO2: 21 mEq/L (ref 19–32)
Calcium: 9.2 mg/dL (ref 8.4–10.5)
Chloride: 105 mEq/L (ref 96–112)
Creatinine, Ser: 3.76 mg/dL — ABNORMAL HIGH (ref 0.4–1.2)
GFR calc Af Amer: 16 mL/min — ABNORMAL LOW (ref >60)
GFR calc non Af Amer: 13 mL/min — ABNORMAL LOW (ref >60)
Glucose, Bld: 121 mg/dL — ABNORMAL HIGH (ref 70–99)

## 2010-10-27 LAB — GLUCOSE, CAPILLARY
Glucose-Capillary: 117 mg/dL — ABNORMAL HIGH (ref 70–99)
Glucose-Capillary: 123 mg/dL — ABNORMAL HIGH (ref 70–99)
Glucose-Capillary: 112 mg/dL — ABNORMAL HIGH (ref 70–99)

## 2010-10-27 LAB — URINE CULTURE
Colony Count: NO GROWTH
Culture: NO GROWTH

## 2010-10-27 LAB — T3, FREE: T3, Free: 1.6 pg/mL — ABNORMAL LOW (ref 2.3–4.2)

## 2010-10-27 NOTE — Procedures (Unsigned)
REFERRING PHYSICIAN:  Dr. Vertell Novak.  HISTORY:  A 50 year old admitted with questionable seizure versus syncopal episode, has multiple comorbidities.  EEG has been requested for further evaluation.  MEDICATIONS:  Fentanyl, Lovenox, Renagel, prednisone, Protonix, Myfortic, Lopressor, Aranesp, Cardizem, Tylenol, Zofran, Benadryl, Ativan, and Vicodin.   Dictation ended at this point.          ______________________________ Levie Heritage, MD    UJ:WJXB D:  10/26/2010 16:20:28  T:  10/27/2010 03:01:45  Job #:  [id number]

## 2010-10-27 NOTE — Consult Note (Signed)
[name], [name]               ACCOUNT NO.:  [id number]  MEDICAL RECORD NO.:  [id number]           PATIENT TYPE:  I  LOCATION:  2603                         FACILITY:  [facility]  PHYSICIAN:  Aram Beecham B. Eliott Nine, M.D.DATE OF BIRTH:  1960-11-02  DATE OF CONSULTATION: DATE OF DISCHARGE:                                CONSULTATION   Primary team managing the patient in the hospital is [facility].  REASON FOR CONSULTATION:  Chronic kidney disease, SLE, nephropathy, status post renal transplant x2.  HISTORY OF PRESENTING ILLNESS:  [name] is a 50 year old woman with history of SLE and stage IV chronic kidney disease status post renal transplant x2, atrial fibrillation, not on Coumadin secondary to bleeding episodes (left thigh and retroperitoneum), diastolic congestive heart failure who was admitted for altered mental status probably secondary to seizures from [facility] in [place].  She was in [facility] from Oct 07, 2010, to Oct 23, 2010, for sepsis secondary to Pseudomonas urinary tract infection and bacteremia and Clostridium difficile colitis and was discharged to [facility] when her acute problems resolved.  Prior to admission, the patient had a brief episode of unresponsiveness in the [facility] with persistent altered mental status after that, so she was readmitted back to [facility] for further evaluation. She had an episode of acute on chronic kidney injury during her most recent past hospitalization, with creatinine that peaked around 6 or so, then dropped to the mid 3's (baseline prior to that admission was mid 2's).  She had been on Lasix 160 QID which was  stopped mid-ay out of concern for volume depletion (had an episode of low BP in the 80's and fellt as if she was "out of her body").  She has had recurrence of significant edema since that time.  At this time, the patient does complain of pain in both  her legs and feeling tired.  She does not have any specific complaints other than that.  She is very lethargic, but has received Ativan and Dilaudid for pain in the past 10- 12 hours.  PHYSICAL EXAMINATION:  VITAL SIGNS:  Temperature 99.1, pulse 100-110, in atrial fibrillation, blood pressure 100-120 systolic upon 78-90 diastolic, respiratory rate 20-24, and oxygen saturation 100% on 2 liters. GENERAL:  Lying in bed, in no acute distress, lethargic. CARDIOVASCULAR:  S1 and S2 normal.  No murmurs. RESPIRATION:  Bilateral coarse breath sounds on the bases. ABDOMEN:  Soft, nontender, and nondistended.  Normal bowel sounds.  She does have some mild tenderness on the left lower quadrant.  She has a left renal allograft. EXTREMITIES:  Bilateral 2+ edema, large dark purple tender area on the right lateral shin, tunneled right internal jugular IV access for hemodialysis. NEUROLOGICAL:  The patient is responding appropriately to questions, although very lethargic.  Her sensations are intact.  Motor strength seems intact, but the patient is not making too much effort.  Cranial nerves look normal.  Could not assess her neurological status further because of lethargic condition.  LABORATORY DATA:  Sodium 142, potassium 3.4, chloride 108, bicarb 22, BUN 97, creatinine 3.69, and  glucose 88.  White count 14.3, hemoglobin 8.8, and platelet count 134.  Alkaline phosphatase 175.  Liver function tests otherwise normal.  PTT 18.9, INR 1.56, albumin 2.1, calcium 9.7, phosphorus 4.8, and magnesium 1.8.  Urinalysis shows moderate leukocytosis with a few squamous epithelial cells and few bacteria.  IMAGING STUDIES: 1. Chest x-ray shows bibasilar opacities suggestive of pleural     effusion or atelectasis which is stable as compared to previous x-     rays. 2. 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     This is stable.  MEDICATIONS IN THE HOSPITAL: 1. Lopressor. 2.  Protonix. 3. Diflucan. 4. Renagel 800 mg t.i.d. 5. PhosLo 676 mg t.i.d. 6. Aranesp. 7. Sliding scale insulin. 8. Mycophenolate 720 mg b.i.d. 9. Prednisone 500 mg b.i.d.  REVIEW OF SYSTEMS:  A comprehensive review of system is negative except as mentioned in the HPI.  ASSESSMENT AND PLAN: 1. 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  The patient seems to be at her new     baseline which was decided to be around 3.5 during her last     admission in this hospital earlier this month.  Her electrolytes     are okay at this time.  Her hemoglobin seems to be at baseline.     She does not have any indication for hemodialysis at this time.     There is no contraindication to restart her mycophenolate at her     home dose, so after discussing with the Primary team we have     decided to increase her mycophenolate to 720 mg twice a day.  She     is on her regular dose of prednisone which seems to be appropriate     at this time.  Her renal function is stable and we will continue to     follow the patient while in the hospital.We will add Lasix at 80 mg by mouth twice a day     for some volume overload on physical exam and monitor her I's and  O's and daily weights closely.  She is making good urine.  She has     diapers at this time for incontinence due to lethargic mental condition but might need Foley catheter down     the road if mental state does not improve.  2. Atrial fibrillation.  The patient is on Cardizem drip. 3. Altered mental status.  Neurology team is following the patient and     is evaluating the patient for seizures.  She had her EEG done today     the results of which are pending.  The plan is not to start her on     any antiseizure meds at this time. 4. SLE.  The patient is on her regular home dose of prednisone at 5 mg     twice a day. 5. Hypertension.  It seems to be well controlled. 6. Anemia.  The patient is at her baseline hemoglobin of 8-9. 7. Right leg  bruise/hematoma.  Very significant in size and is almost fluctuant    on exam.  The Vascular Surgery team has been consulted by    the Primary team and will evaluate the patient.  Husband requests that Dr. Noelle Penner see     also.  Thank you for this consultation.  We will follow the patient closely while in the hospital.     Bethel Born, MD   ______________________________ Duke Salvia. Eliott Nine,  M.D.    MD/MEDQ  D:  10/26/2010  T:  10/27/2010  Job:  [id number]  Electronically Signed by Bethel Born  on 10/27/2010 06:48:39 AM Electronically Signed by Camille Bal M.D. on 10/27/2010 03:54:26 PM

## 2010-10-27 NOTE — Procedures (Unsigned)
EEG NUMBER:  REASON FOR CONSULTATION:  A 50 year old woman with multiple comorbidities, had questionable blanking spells versus syncopal episode. EEG is being performed for further evaluation.  MEDICATIONS:  Fentanyl, Lovenox, Renagel, prednisone, Protonix, Myfortic, Lopressor, Ativan, Vicodin, Cardizem, Tylenol, Zofran, Xopenex, Benadryl.  EEG DURATION:  22 minutes of EEG recording.  EEG DESCRIPTION:  This is a routine 18-channel adult EEG recording with 1 channel devoted to limited EKG recording.  Activation procedure is performed during the photic stimulation.  Study is performed mostly in asleep state.  As the EEG opens up, I noticed that it starts with the sleep pattern and I can clearly see synchronous spindles and well-formed K complexes.  There is no clear location for a posterior dominant rhythm as the patient does not fully awake throughout the study.  However, the background rhythm consist of a normal 8 Hz frequency with an amplitude of 22 mcV at the most.  The background rhythm is reactive as well.  I do not see any electrographic seizures or epileptiform discharges based on the study.  No driving was noted with the photic stimulation and posteriorly either.  EEG INTERPRETATION:  This is a normal sleep EEG.  There is no evidence to suggest electrographic seizures or epileptiform discharges or even obvious advanced encephalopathy based on the study.  Clinical correlation is advised.          ______________________________ Levie Heritage, MD    [id number] D:  10/26/2010 16:28:10  T:  10/27/2010 04:47:03  Job #:  [id number]

## 2010-10-28 LAB — BASIC METABOLIC PANEL
BUN: 93 mg/dL — ABNORMAL HIGH (ref 6–23)
CO2: 21 mEq/L (ref 19–32)
Calcium: 9.3 mg/dL (ref 8.4–10.5)
Chloride: 105 mEq/L (ref 96–112)
Creatinine, Ser: 3.51 mg/dL — ABNORMAL HIGH (ref 0.4–1.2)

## 2010-10-28 LAB — GLUCOSE, CAPILLARY
Glucose-Capillary: 121 mg/dL — ABNORMAL HIGH (ref 70–99)
Glucose-Capillary: 82 mg/dL (ref 70–99)

## 2010-10-28 LAB — CBC
MCH: 31.5 pg (ref 26.0–34.0)
MCV: 92.8 fL (ref 78.0–100.0)
Platelets: 167 10*3/uL (ref 150–400)
RDW: 18.2 % — ABNORMAL HIGH (ref 11.5–15.5)
WBC: 14.3 10*3/uL — ABNORMAL HIGH (ref 4.0–10.5)

## 2010-10-29 LAB — GLUCOSE, CAPILLARY
Glucose-Capillary: 86 mg/dL (ref 70–99)
Glucose-Capillary: 109 mg/dL — ABNORMAL HIGH (ref 70–99)

## 2010-10-29 NOTE — Consult Note (Signed)
[name], [name] NO.:  [id number]  MEDICAL RECORD NO.:  [id number]           PATIENT TYPE:  I  LOCATION:  2104                         FACILITY:  [facility]  PHYSICIAN:  Zetta Bills, MD          DATE OF BIRTH:  1960-06-28  DATE OF CONSULTATION: DATE OF DISCHARGE:                                CONSULTATION   REFERRAL ATTENDING:  Jeoffrey Massed, MD.  ATTENDING FOR CONSULTATION:  [facility], Dr. Allena Katz.  CHIEF COMPLAINT:  Altered mental status and acute renal failure.  HISTORY OF PRESENT ILLNESS:  The patient is a 50 year old Caucasian female status post double renal transplant in 1989 and 2001, recently discharged on September 09, 2010, from [facility], status post pneumonia and C. diff colitis, found to be weak and had altered mental status at home, and brought in by her husband to the emergency room where she was found to be uremic with an anion gap metabolic acidosis and respiratory compensation as well as acute renal failure and ATN, possible right lower lobe pneumonia with imaging on chest x-ray and her procalcitonin elevated from 45 down to 33, and her white blood cell count of 28,000. Currently, afebrile without diarrhea and no vital signs instability.  PAST MEDICAL HISTORY:  Includes end-stage renal disease from systemic lupus erythematosus with renal transplant in 1989 and 2001, a history of hypertension, Afib, tertiary hyperparathyroidism.  SOCIAL HISTORY:  Lives with husband on disability.  No drugs, alcohol or smoking history.  FAMILY HISTORY:  Noncontributory.  REVIEW OF SYSTEMS:  Positive for fatigue, shortness of breath, claudication, dysuria, weakness, depression.  MEDICATIONS:  Cefepime, Levaquin, Flagyl, vancomycin, Maico ventilate today at 8 o'clock, Solu-Medrol, normal saline 250 cc/hour bolus, heparin drip and bicarb.  These are hospital medications.  ALLERGIES:  COMPAZINE AND SULFA DRUGS.  PHYSICAL EXAMINATION:   VITALS:  Temperature 97.6 T-max, pulse 65-70, respirator rate 20, blood pressure 81-121/54/102, oxygen 99% on room air. GENERAL:  I's and O's 2677 in and 93 urine output. HEENT:  Extraocular muscles intact.  Eyes are roving. NECK:  Supple.  Lymphadenopathy none. CARDIOVASCULAR:  Regular rate and rhythm. LUNGS:  Right lung, left lung has wheezing. SKIN:  No rash. GI:  Abdomen is tender in the left lower quadrant.  Nondistended.  No guarding.  She has a right IJ triple lumen. EXTREMITIES:  Her lower extremities have bilateral ischemic skin changes as well as decreased pulse on the right dorsalis pedis and swelling of the right and coolness of the right lower extremity. NEURO:  Alert and oriented x3, although her mentation has been slowed.  Chest x-ray right, lower lobe pneumonia.  Renal ultrasound, trance-like kidneys appears unremarkable.  LABS:  White count 28.6-23.9, 9.5/30 H and H, 284 platelets.  BMP 132/3.7, 100/8, 98/5.48 down from 6.  GFR of 8 and a glucose of 87.  Her anion gap is 26.  Her UA had large blood, large leukocytes, and nitrites negative.  She has a recent urine culture that grew out Pantoea agglomerans greater than 100,000 culture units in the Enterobacter family susceptible to everything except for nitrofurantoin. Procalcitonin  45 down to 33.  Her blood culture no growth.  Calcium is 8.2,  albumin 1.9, phosphorus 10.7, and lipase 239.  ASSESSMENT AND PLAN:  The patient is a 50 year old Caucasian female with altered mental status recently discharged with pneumonia and Clostridium difficile colitis, who returns with altered mental status and leukocytosis as well as acute renal failure and residual right lower lobe opacity on chest x-ray and recent urinary tract infection with Pantoea agglomerans greater than 100,000 colony units with a CT scan revealing left-sided colitis and bladder inflammation with maybe some superior pole stranding of her transplant  kidney. 1. Acute renal failure, precipitous single kidney from status post     transplant with urinary tract infection with greater than 100,000     Enterobacter group bacteria Pantoea agglomerans with bladder     stranding and thickening on CT and uremia with anion gap metabolic     acidosis.  May need HT, but we will try to hydrate her with fluids     and sodium bicarb and we will follow her creatinine, this afternoon     we will get a BMP and trend it.  It should have been worsening she     may need to get access for hemodialysis. 2. Uremia anion gap metabolic acidosis with anion gap of 26 with     respiratory compensation secondary to renal failure, status post     urinary tract infection with some underlying dehydration from     recent colitis, unlikely to be Clostridium difficile at this time     with no diarrhea. 3. Pneumonia, less likely with elevated procalcitonin and a right     lower lobe small opacification on her chest x-ray. 4. Clostridium difficile, not likely, but does have evidence of     colitis on CT.  The patient is being treated with Flagyl,     vancomycin, cefepime, and Levaquin with broad coverage.    ______________________________ Edd Arbour, MD   ______________________________ Zetta Bills, MD    JO/MEDQ  D:  10/08/2010  T:  10/09/2010  Job:  [id number]  Electronically Signed by Edd Arbour MD on 10/14/2010 12:38:57 PM Electronically Signed by Zetta Bills MD on 10/29/2010 04:00:19 PM

## 2010-10-30 LAB — CBC
MCH: 29.3 pg (ref 26.0–34.0)
MCHC: 31.4 g/dL (ref 30.0–36.0)
MCV: 93.2 fL (ref 78.0–100.0)
Platelets: 176 10*3/uL (ref 150–400)
RBC: 2.8 MIL/uL — ABNORMAL LOW (ref 3.87–5.11)

## 2010-10-30 LAB — RENAL FUNCTION PANEL
Albumin: 2.2 g/dL — ABNORMAL LOW (ref 3.5–5.2)
BUN: 93 mg/dL — ABNORMAL HIGH (ref 6–23)
CO2: 21 mEq/L (ref 19–32)
Chloride: 106 mEq/L (ref 96–112)
Creatinine, Ser: 3.25 mg/dL — ABNORMAL HIGH (ref 0.4–1.2)

## 2010-10-30 LAB — GLUCOSE, CAPILLARY
Glucose-Capillary: 115 mg/dL — ABNORMAL HIGH (ref 70–99)
Glucose-Capillary: 106 mg/dL — ABNORMAL HIGH (ref 70–99)

## 2010-10-31 LAB — GLUCOSE, CAPILLARY
Glucose-Capillary: 83 mg/dL (ref 70–99)
Glucose-Capillary: 140 mg/dL — ABNORMAL HIGH (ref 70–99)
Glucose-Capillary: 118 mg/dL — ABNORMAL HIGH (ref 70–99)

## 2010-10-31 LAB — BASIC METABOLIC PANEL
GFR calc non Af Amer: 15 mL/min — ABNORMAL LOW (ref >60)
Potassium: 3.6 mEq/L (ref 3.5–5.1)
Sodium: 140 mEq/L (ref 135–145)

## 2010-10-31 LAB — CBC
Platelets: 182 10*3/uL (ref 150–400)
RDW: 18.3 % — ABNORMAL HIGH (ref 11.5–15.5)
WBC: 11.3 10*3/uL — ABNORMAL HIGH (ref 4.0–10.5)

## 2010-11-01 LAB — CBC
MCHC: 31.1 g/dL (ref 30.0–36.0)
RDW: 18.3 % — ABNORMAL HIGH (ref 11.5–15.5)

## 2010-11-01 LAB — RENAL FUNCTION PANEL
Calcium: 8.7 mg/dL (ref 8.4–10.5)
GFR calc Af Amer: 20 mL/min — ABNORMAL LOW (ref >60)
GFR calc non Af Amer: 17 mL/min — ABNORMAL LOW (ref >60)
Glucose, Bld: 126 mg/dL — ABNORMAL HIGH (ref 70–99)
Phosphorus: 3.8 mg/dL (ref 2.3–4.6)
Sodium: 141 mEq/L (ref 135–145)

## 2010-11-01 LAB — GLUCOSE, CAPILLARY: Glucose-Capillary: 98 mg/dL (ref 70–99)

## 2010-11-02 LAB — CBC
HCT: 26.1 % — ABNORMAL LOW (ref 36.0–46.0)
MCV: 93.5 fL (ref 78.0–100.0)
RDW: 18.3 % — ABNORMAL HIGH (ref 11.5–15.5)
WBC: 11 10*3/uL — ABNORMAL HIGH (ref 4.0–10.5)

## 2010-11-02 LAB — CULTURE, BLOOD (ROUTINE X 2)
Culture  Setup Time: 201205222337
Culture: NO GROWTH

## 2010-11-02 LAB — BASIC METABOLIC PANEL
BUN: 81 mg/dL — ABNORMAL HIGH (ref 6–23)
CO2: 24 mEq/L (ref 19–32)
GFR calc non Af Amer: 19 mL/min — ABNORMAL LOW (ref >60)
Glucose, Bld: 88 mg/dL (ref 70–99)
Potassium: 3.5 mEq/L (ref 3.5–5.1)

## 2010-11-02 LAB — GLUCOSE, CAPILLARY
Glucose-Capillary: 102 mg/dL — ABNORMAL HIGH (ref 70–99)
Glucose-Capillary: 123 mg/dL — ABNORMAL HIGH (ref 70–99)

## 2010-11-03 ENCOUNTER — Encounter: Payer: Self-pay | Admitting: Pulmonary Disease

## 2010-11-03 LAB — GLUCOSE, CAPILLARY
Glucose-Capillary: 95 mg/dL (ref 70–99)
Glucose-Capillary: 112 mg/dL — ABNORMAL HIGH (ref 70–99)

## 2010-11-03 NOTE — H&P (Signed)
NAMEPHILANA, [name]               ACCOUNT NO.:  [id number]  MEDICAL RECORD NO.:  [id number]           PATIENT TYPE:  I  LOCATION:  2603                         FACILITY:  [facility]  PHYSICIAN:  Kathlen Mody, MD       DATE OF BIRTH:  December 09, 1960  DATE OF ADMISSION:  10/25/2010 DATE OF DISCHARGE:                             HISTORY & PHYSICAL   REASON FOR ADMISSION:  [name] is a 50 year old chronically ill lady with multiple medical problems including SLE, renal transplant, chronic atrial fibrillation not on Coumadin, multiple episodes of pneumonia, recent C. Diff, pseudomonas UTI was transferred from the [facility] to ER today for unresponsiveness lasting for 3 minutes associated with some blinking of the eyes and bowel incontinence.  I spoke to Dr. Karie Soda, physician at [facility]. She was being sent downstairs for neuro workup for possible seizure activity Dr. Hoy Morn neurologist was consulted and he requested hospital service to admit the patient as the patient has multiple medical problems and she will be evaluated for seizure activity.  She will get an MRI of the brain and an EEG later today.  The patient's mental status appears to be at baseline or slightly worse from the medications that she got while she was at [facility] after being unresponsiveness episode, she got a 2 mg of Ativan.  While she was in the ER, she got some fentanyl and her mental status appears a little bit more worse than her baseline, but she is answering questions appropriately.  She is able to move all her extremities.  She does complain of back pain and her pain in the lower extremities.  She denies any chest pain, trouble breathing. She denied any nausea, vomiting, or abdominal pain.  History is very limited secondary to the patient's mental status.  REVIEW OF SYSTEMS:  See HPI, otherwise negative.  PAST MEDICAL HISTORY: 1. Stage IV chronic kidney disease with prior renal  transplant, on     chronic immunosuppression. 2. History of atrial fibrillation, not on Coumadin secondary to     hematoma of the left leg requiring multiple surgical procedures. 3. 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 4. History of recent C. diff colitis. 5. History of recent pneumonia. 6. Systemic lupus erythematous. 7. 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 8. SIRS. 9. Recently treated Pseudomonas urinary tract infection. 10.Right lower extremity hematoma from anticoagulation. 11.Right lower extremity DVT. 12.Compensated diastolic heart failure with preserved systolic     function. 13.History of toxic metabolic encephalopathy from infections. 14.Anemia of blood loss/anemia of chronic disease. 15.Mild thrombocytopenia. 16.Acute retroperitoneal bleed secondary to anticoagulation. 17.Hypertension. 18.Hypoalbuminemia. 19.History of oral thrush.  HOME MEDICATIONS:  See med rec on for detailed home meds and their doses.  FAMILY HISTORY:  Noncontributory.  SOCIAL HISTORY:  The patient is married.  Denies smoking, EtOH, or IV drug abuse.  ALLERGIES:  COMPAZINE, SULFA DRUGS, and AMBIEN.  PHYSICAL EXAMINATION:  VITAL SIGNS:  The patient's vitals include a temperature of 97.7, pulse is 60 per minute.  She came in with  a heart rate of 110, blood pressure 149/84, respiratory rate 18, saturating by 100% on room air. GENERAL:  She is arousable, but lethargic, responding verbal commands. HEENT:  Pupils are reacting to light. NECK:  No JVD. CARDIOVASCULAR:  S1 and S2 heard. RESPIRATORY:  Decreased air entry in bilaterally.  No wheezing. ABDOMEN:  Soft, tenderness in the lower quadrant.  Bowel sounds are present. EXTREMITIES:  She has got right lower extremity hematoma. SKIN:  She has got decubitus ulcers. NEUROLOGIC:  The patient is arousable, follows simple commands.  She is lethargic.  She is able to move all  extremities.  PERTINENT LABS:  In the ER, she had a CBC done which showed a WBC count of 16.2, hemoglobin of 9.2, hematocrit of 29.3, platelets of 171. Comprehensive metabolic panel showed a sodium of 143, potassium of 3.6, chloride of 107, bicarb of 22, glucose of 85, creatinine of 3.61, BUN of 96, alkaline phosphatase of 178.  Albumin of 2.2.  RADIOLOGY:  She had a CT head without contrast which shows progression of atrophy, no acute abnormalities.  ASSESSMENT AND PLAN: 1. This is a 50 year old lady with multiple medical problems.  She was     transferred from [facility] to [facility] ER for     evaluation of unresponsiveness to rule out possible seizure     activity.  Neurologist, Dr. Hoy Morn was consulted.  She will get an     MRI of the brain.  We will get an EEG later tomorrow.  The patient     will be admitted to step-down for closer monitoring.  She will be     on seizure precautions.  She is not to be started on any     antiseizure medications at this time.  We can start her on Ativan 1-     2 mg p.r.n. for seizure activity.  Her unresponsiveness could be     secondary to possible seizure activity versus infection, recurrent     urinary tract infection versus cerebritis secondary to lupus versus     secondary to deconditioning. 2. Atrial fibrillation, not on Coumadin.  On arrival to the ER, her     AFib was slightly, her rate was not controlled.  She was started on     Cardizem drip.  At this time, she got a bolus dose and she is on     Cardizem GTT. 3. Stage IV chronic kidney disease status post renal transplant.     Renal will be consulted as needed. 4. Systemic lupus erythematosus.  The patient is on prednisone 5 mg     twice a day with meals.  We will continue the same. 5. Right lower extremity hematoma/decubitus ulcers in the back.  We     will get a wound care consult to evaluate and address those issues. 6. Hypertension appears to be controlled at this time.   We will     continue with metoprolol 100 mg b.i.d. 7. Thrombocytopenia.  Appears to have resolved. 8. Anemia secondary to blood loss/chronic anemia of chronic disease.     Her baseline hemoglobin is around 8-9 and her hemoglobin is at 9.2     today. 9. Leukocytosis most likely secondary to being on chronic steroids,     but will rule out urinary tract infection with a     UA and urine culture.  She has a urinalysis done a few days ago     with a urine culture on May 16,  which showed no growth, but will     get a repeat UA and urine culture. 10.The patient is a full code.          ______________________________ Kathlen Mody, MD     VA/MEDQ  D:  10/25/2010  T:  10/25/2010  Job:  [id number]  Electronically Signed by Kathlen Mody MD on 11/03/2010 01:24:39 AM

## 2010-11-04 LAB — DIFFERENTIAL
Basophils Relative: 0 % (ref 0–1)
Eosinophils Absolute: 0 10*3/uL (ref 0.0–0.7)
Eosinophils Relative: 0 % (ref 0–5)
Lymphs Abs: 1 10*3/uL (ref 0.7–4.0)
Monocytes Absolute: 1.6 10*3/uL — ABNORMAL HIGH (ref 0.1–1.0)
Monocytes Relative: 10 % (ref 3–12)

## 2010-11-04 LAB — RENAL FUNCTION PANEL
Albumin: 2 g/dL — ABNORMAL LOW (ref 3.5–5.2)
BUN: 76 mg/dL — ABNORMAL HIGH (ref 6–23)
Calcium: 9 mg/dL (ref 8.4–10.5)
Creatinine, Ser: 2.58 mg/dL — ABNORMAL HIGH (ref 0.4–1.2)
GFR calc Af Amer: 24 mL/min — ABNORMAL LOW (ref >60)
GFR calc non Af Amer: 20 mL/min — ABNORMAL LOW (ref >60)

## 2010-11-04 LAB — CBC
MCH: 29.2 pg (ref 26.0–34.0)
MCHC: 31 g/dL (ref 30.0–36.0)
MCV: 94.4 fL (ref 78.0–100.0)
Platelets: 300 10*3/uL (ref 150–400)

## 2010-11-04 LAB — GLUCOSE, CAPILLARY
Glucose-Capillary: 69 mg/dL — ABNORMAL LOW (ref 70–99)
Glucose-Capillary: 121 mg/dL — ABNORMAL HIGH (ref 70–99)
Glucose-Capillary: 147 mg/dL — ABNORMAL HIGH (ref 70–99)

## 2010-11-05 ENCOUNTER — Inpatient Hospital Stay (HOSPITAL_COMMUNITY): Payer: BC Managed Care – PPO

## 2010-11-05 LAB — GLUCOSE, CAPILLARY: Glucose-Capillary: 106 mg/dL — ABNORMAL HIGH (ref 70–99)

## 2010-11-06 LAB — RENAL FUNCTION PANEL
Albumin: 1.8 g/dL — ABNORMAL LOW (ref 3.5–5.2)
CO2: 23 mEq/L (ref 19–32)
Chloride: 104 mEq/L (ref 96–112)
Creatinine, Ser: 2.99 mg/dL — ABNORMAL HIGH (ref 0.4–1.2)
GFR calc Af Amer: 20 mL/min — ABNORMAL LOW (ref >60)
GFR calc non Af Amer: 17 mL/min — ABNORMAL LOW (ref >60)
Potassium: 3.7 mEq/L (ref 3.5–5.1)
Sodium: 139 mEq/L (ref 135–145)

## 2010-11-06 LAB — PROTIME-INR
INR: 1.33 (ref 0.00–1.49)
Prothrombin Time: 16.7 seconds — ABNORMAL HIGH (ref 11.6–15.2)

## 2010-11-06 LAB — GLUCOSE, CAPILLARY
Glucose-Capillary: 92 mg/dL (ref 70–99)
Glucose-Capillary: 161 mg/dL — ABNORMAL HIGH (ref 70–99)

## 2010-11-06 LAB — CBC
Hemoglobin: 7.8 g/dL — ABNORMAL LOW (ref 12.0–15.0)
MCH: 29 pg (ref 26.0–34.0)
Platelets: 256 10*3/uL (ref 150–400)
RBC: 2.69 MIL/uL — ABNORMAL LOW (ref 3.87–5.11)
WBC: 14.3 10*3/uL — ABNORMAL HIGH (ref 4.0–10.5)

## 2010-11-06 LAB — APTT: aPTT: 27 seconds (ref 24–37)

## 2010-11-07 ENCOUNTER — Inpatient Hospital Stay (HOSPITAL_COMMUNITY): Payer: BC Managed Care – PPO

## 2010-11-07 LAB — BASIC METABOLIC PANEL
BUN: 74 mg/dL — ABNORMAL HIGH (ref 6–23)
CO2: 23 mEq/L (ref 19–32)
Chloride: 105 mEq/L (ref 96–112)
GFR calc non Af Amer: 18 mL/min — ABNORMAL LOW (ref >60)
Glucose, Bld: 100 mg/dL — ABNORMAL HIGH (ref 70–99)
Potassium: 2.8 mEq/L — ABNORMAL LOW (ref 3.5–5.1)
Sodium: 141 mEq/L (ref 135–145)

## 2010-11-07 LAB — GLUCOSE, CAPILLARY

## 2010-11-08 LAB — GLUCOSE, CAPILLARY
Glucose-Capillary: 89 mg/dL (ref 70–99)
Glucose-Capillary: 66 mg/dL — ABNORMAL LOW (ref 70–99)
Glucose-Capillary: 53 mg/dL — ABNORMAL LOW (ref 70–99)

## 2010-11-08 LAB — BASIC METABOLIC PANEL
CO2: 24 mEq/L (ref 19–32)
Chloride: 107 mEq/L (ref 96–112)
GFR calc Af Amer: 20 mL/min — ABNORMAL LOW (ref >60)
Sodium: 143 mEq/L (ref 135–145)

## 2010-11-08 LAB — CBC
Hemoglobin: 7.6 g/dL — ABNORMAL LOW (ref 12.0–15.0)
RBC: 2.75 MIL/uL — ABNORMAL LOW (ref 3.87–5.11)

## 2010-11-08 LAB — IRON AND TIBC

## 2010-11-09 LAB — RENAL FUNCTION PANEL
Albumin: 1.9 g/dL — ABNORMAL LOW (ref 3.5–5.2)
CO2: 26 mEq/L (ref 19–32)
Calcium: 9.4 mg/dL (ref 8.4–10.5)
GFR calc Af Amer: 20 mL/min — ABNORMAL LOW (ref >60)
GFR calc non Af Amer: 16 mL/min — ABNORMAL LOW (ref >60)
Sodium: 141 mEq/L (ref 135–145)

## 2010-11-09 LAB — GLUCOSE, CAPILLARY
Glucose-Capillary: 115 mg/dL — ABNORMAL HIGH (ref 70–99)
Glucose-Capillary: 93 mg/dL (ref 70–99)

## 2010-11-09 LAB — CBC
Hemoglobin: 7.7 g/dL — ABNORMAL LOW (ref 12.0–15.0)
MCH: 29.1 pg (ref 26.0–34.0)
MCV: 94.3 fL (ref 78.0–100.0)
RBC: 2.65 MIL/uL — ABNORMAL LOW (ref 3.87–5.11)

## 2010-11-10 ENCOUNTER — Inpatient Hospital Stay (HOSPITAL_COMMUNITY): Payer: BC Managed Care – PPO

## 2010-11-10 LAB — BASIC METABOLIC PANEL
BUN: 73 mg/dL — ABNORMAL HIGH (ref 6–23)
CO2: 22 mEq/L (ref 19–32)
Calcium: 8.9 mg/dL (ref 8.4–10.5)
Chloride: 106 mEq/L (ref 96–112)
Creatinine, Ser: 3.06 mg/dL — ABNORMAL HIGH (ref 0.4–1.2)
Glucose, Bld: 86 mg/dL (ref 70–99)

## 2010-11-10 LAB — CBC
MCH: 28.3 pg (ref 26.0–34.0)
MCHC: 30.9 g/dL (ref 30.0–36.0)
MCV: 91.5 fL (ref 78.0–100.0)
Platelets: 224 10*3/uL (ref 150–400)
RDW: 17.3 % — ABNORMAL HIGH (ref 11.5–15.5)
WBC: 14.6 10*3/uL — ABNORMAL HIGH (ref 4.0–10.5)

## 2010-11-10 LAB — RENAL FUNCTION PANEL
Albumin: 2 g/dL — ABNORMAL LOW (ref 3.5–5.2)
BUN: 70 mg/dL — ABNORMAL HIGH (ref 6–23)
Calcium: 8.6 mg/dL (ref 8.4–10.5)
Creatinine, Ser: 3.06 mg/dL — ABNORMAL HIGH (ref 0.4–1.2)
Phosphorus: 4.3 mg/dL (ref 2.3–4.6)

## 2010-11-10 LAB — GLUCOSE, CAPILLARY: Glucose-Capillary: 101 mg/dL — ABNORMAL HIGH (ref 70–99)

## 2010-11-10 LAB — POTASSIUM: Potassium: 3.8 mEq/L (ref 3.5–5.1)

## 2010-11-11 ENCOUNTER — Inpatient Hospital Stay (HOSPITAL_COMMUNITY): Payer: BC Managed Care – PPO

## 2010-11-11 DIAGNOSIS — J690 Pneumonitis due to inhalation of food and vomit: Secondary | ICD-10-CM

## 2010-11-11 DIAGNOSIS — A419 Sepsis, unspecified organism: Secondary | ICD-10-CM

## 2010-11-11 DIAGNOSIS — J9600 Acute respiratory failure, unspecified whether with hypoxia or hypercapnia: Secondary | ICD-10-CM

## 2010-11-11 DIAGNOSIS — A0472 Enterocolitis due to Clostridium difficile, not specified as recurrent: Secondary | ICD-10-CM

## 2010-11-11 LAB — URINALYSIS, ROUTINE W REFLEX MICROSCOPIC
Bilirubin Urine: NEGATIVE
Ketones, ur: NEGATIVE mg/dL
Nitrite: NEGATIVE
Protein, ur: 100 mg/dL — AB
Urobilinogen, UA: 0.2 mg/dL (ref 0.0–1.0)
pH: 5.5 (ref 5.0–8.0)

## 2010-11-11 LAB — GLUCOSE, CAPILLARY
Glucose-Capillary: 122 mg/dL — ABNORMAL HIGH (ref 70–99)
Glucose-Capillary: 104 mg/dL — ABNORMAL HIGH (ref 70–99)
Glucose-Capillary: 218 mg/dL — ABNORMAL HIGH (ref 70–99)
Glucose-Capillary: 199 mg/dL — ABNORMAL HIGH (ref 70–99)

## 2010-11-11 LAB — POCT I-STAT 3, ART BLOOD GAS (G3+)
Acid-base deficit: 4 mmol/L — ABNORMAL HIGH (ref 0.0–2.0)
O2 Saturation: 98 %
TCO2: 23 mmol/L (ref 0–100)

## 2010-11-11 LAB — PHOSPHORUS: Phosphorus: 4.2 mg/dL (ref 2.3–4.6)

## 2010-11-11 LAB — BASIC METABOLIC PANEL
CO2: 21 mEq/L (ref 19–32)
Chloride: 112 mEq/L (ref 96–112)
Chloride: 109 mEq/L (ref 96–112)
Creatinine, Ser: 2.71 mg/dL — ABNORMAL HIGH (ref 0.4–1.2)
GFR calc Af Amer: 23 mL/min — ABNORMAL LOW (ref >60)
GFR calc non Af Amer: 18 mL/min — ABNORMAL LOW (ref >60)
Glucose, Bld: 97 mg/dL (ref 70–99)
Glucose, Bld: 112 mg/dL — ABNORMAL HIGH (ref 70–99)
Potassium: 4 mEq/L (ref 3.5–5.1)
Sodium: 148 mEq/L — ABNORMAL HIGH (ref 135–145)
Sodium: 146 mEq/L — ABNORMAL HIGH (ref 135–145)

## 2010-11-11 LAB — RENAL FUNCTION PANEL
Albumin: 2.1 g/dL — ABNORMAL LOW (ref 3.5–5.2)
BUN: 72 mg/dL — ABNORMAL HIGH (ref 6–23)
Calcium: 9.3 mg/dL (ref 8.4–10.5)
Creatinine, Ser: 2.99 mg/dL — ABNORMAL HIGH (ref 0.4–1.2)
GFR calc Af Amer: 20 mL/min — ABNORMAL LOW (ref >60)
GFR calc non Af Amer: 17 mL/min — ABNORMAL LOW (ref >60)
Phosphorus: 4.4 mg/dL (ref 2.3–4.6)

## 2010-11-11 LAB — CBC
HCT: 29.4 % — ABNORMAL LOW (ref 36.0–46.0)
Hemoglobin: 9.2 g/dL — ABNORMAL LOW (ref 12.0–15.0)
MCH: 28.5 pg (ref 26.0–34.0)
MCH: 28.5 pg (ref 26.0–34.0)
MCHC: 31.3 g/dL (ref 30.0–36.0)
MCV: 91 fL (ref 78.0–100.0)
Platelets: 218 10*3/uL (ref 150–400)
RDW: 16.9 % — ABNORMAL HIGH (ref 11.5–15.5)
RDW: 17 % — ABNORMAL HIGH (ref 11.5–15.5)

## 2010-11-11 LAB — CARDIAC PANEL(CRET KIN+CKTOT+MB+TROPI)
Relative Index: INVALID (ref 0.0–2.5)
Total CK: 16 U/L (ref 7–177)

## 2010-11-11 LAB — PRO B NATRIURETIC PEPTIDE: Pro B Natriuretic peptide (BNP): 65262 pg/mL — ABNORMAL HIGH (ref 0–125)

## 2010-11-11 LAB — URINE MICROSCOPIC-ADD ON

## 2010-11-11 LAB — MRSA PCR SCREENING: MRSA by PCR: NEGATIVE

## 2010-11-11 LAB — CLOSTRIDIUM DIFFICILE BY PCR

## 2010-11-11 LAB — LACTIC ACID, PLASMA: Lactic Acid, Venous: 1.6 mmol/L (ref 0.5–2.2)

## 2010-11-12 ENCOUNTER — Inpatient Hospital Stay (HOSPITAL_COMMUNITY): Payer: BC Managed Care – PPO

## 2010-11-12 ENCOUNTER — Ambulatory Visit: Payer: BC Managed Care – PPO | Admitting: Internal Medicine

## 2010-11-12 LAB — GLUCOSE, CAPILLARY
Glucose-Capillary: 127 mg/dL — ABNORMAL HIGH (ref 70–99)
Glucose-Capillary: 142 mg/dL — ABNORMAL HIGH (ref 70–99)
Glucose-Capillary: 144 mg/dL — ABNORMAL HIGH (ref 70–99)
Glucose-Capillary: 149 mg/dL — ABNORMAL HIGH (ref 70–99)
Glucose-Capillary: 90 mg/dL (ref 70–99)

## 2010-11-12 LAB — PHOSPHORUS: Phosphorus: 3.7 mg/dL (ref 2.3–4.6)

## 2010-11-12 LAB — BASIC METABOLIC PANEL
Chloride: 104 mEq/L (ref 96–112)
GFR calc Af Amer: 23 mL/min — ABNORMAL LOW (ref >60)
Potassium: 3.3 mEq/L — ABNORMAL LOW (ref 3.5–5.1)
Sodium: 139 mEq/L (ref 135–145)

## 2010-11-12 LAB — MAGNESIUM: Magnesium: 1.3 mg/dL — ABNORMAL LOW (ref 1.5–2.5)

## 2010-11-12 LAB — CBC
Hemoglobin: 8.4 g/dL — ABNORMAL LOW (ref 12.0–15.0)
Platelets: 173 10*3/uL (ref 150–400)
RBC: 2.96 MIL/uL — ABNORMAL LOW (ref 3.87–5.11)
WBC: 18 10*3/uL — ABNORMAL HIGH (ref 4.0–10.5)

## 2010-11-12 NOTE — Op Note (Signed)
[name], [name] NO.:  [id number]  MEDICAL RECORD NO.:  [id number]  LOCATION:                                 FACILITY:  PHYSICIAN:  Loreta Ave, MD DATE OF BIRTH:  07-Aug-1960  DATE OF PROCEDURE:  11/09/2010 DATE OF DISCHARGE:                              OPERATIVE REPORT   PREOPERATIVE DIAGNOSIS:  Right leg wound.  POSTOPERATIVE DIAGNOSIS:  Right leg wound.  PROCEDURE PERFORMED: 1. Application of split-thickness skin graft 20 x 8 sq cm 2. Application of VAC greater than 50 sq cm.  SURGEON:  Loreta Ave, MD  ASSISTANT:  Tonye Becket, NP  ANESTHESIA:  General.  ESTIMATED BLOOD LOSS:  Minimal.  COMPLICATIONS:  None.  IV FLUIDS:  Per Anesthesia.  URINE OUTPUT:  Not recorded.  CLINICAL INDICATION:  [name] is a 50 year old female who is status post debridement of right leg wound secondary to hematoma.  She has adequate granulation tissue at this time and no exposed periosteum or bone in the right leg and I have recommended split-thickness skin graft coverage.  She understands risks of surgery to include, but not be limited to bleeding, infection, damage to nearby structures, stiffness, scarring, failure of a skin graft as well as wound healing complications and the need for more surgery.  She desire to proceed.  DESCRIPTION OF OPERATION:  The patient was brought to the operating room, placed in the supine position on the operating room table.  After smooth and routine induction of general anesthesia, the right lower extremity was prepped with Betadine scrub and paint draped into a sterile field.  The right lower extremity wound over the anterior tibia was measured is being 20 x 8 cm in the shape of a oval.  There was adequate granulation tissue and no evidence of erythema or purulence. There was no extensive necrotic tissue.  The wound was gently abraded with the back of a pickup and irrigated with 2 liters of normal  saline via pulse lavage.  Hemostasis was then obtained via Bovie electrocautery.  Next, a Zimmer dermatome was used to eight one- thousandths of an inch thick to harvest 2 strips each with a 2-inch guard.  Both of these over 20 cm long and were meshed in a 1.5-1 fashion.  Next, there was sewn dermis side down into the wound and trimmed to fit the defect.  They were sewn with a running 4-0 chromic sutures.  Next, epinephrine and normal saline and impregnated sponges placed on the donor site for hemostasis.  The right thigh was then dressed with Mepilex border and Mepitel was placed over the skin graft recipient site as was the back dressing was placed to 125 mm continuous suction with excellent seal.  The sponge and needle counts reported as correct x2.  Next, dry sterile dressing including a Kerlix, ABD pads and Ace wrap were applied to the right lower extremity to bolster the back dressing.  The patient was extubated and transferred to the recovery room in stable condition.     Loreta Ave, MD     CF/MEDQ  D:  11/09/2010  T:  11/10/2010  Job:  [id number]  Electronically Signed by Cristal Deer  Rashee Marschall MD on 11/12/2010 02:17:28 PM

## 2010-11-12 NOTE — Op Note (Signed)
  [name], [name]               ACCOUNT NO.:  [id number]  MEDICAL RECORD NO.:  [id number]           PATIENT TYPE:  I  LOCATION:  6743                         FACILITY:  [facility]  PHYSICIAN:  Loreta Ave, MD DATE OF BIRTH:  [birth year]  DATE OF PROCEDURE:  11/06/2010 DATE OF DISCHARGE:                              OPERATIVE REPORT   PREOPERATIVE DIAGNOSIS:  Right leg wound.  POSTOPERATIVE DIAGNOSIS:  Right leg wound.  PROCEDURE PERFORMED: 1. Debridement of right leg wound including skin and subcutaneous     tissue, 14 x 25 cm. 2. Application of VAC dressing, greater than 50 sq cm.  SURGEON:  Loreta Ave, MD.  ASSISTANT:  Tonye Becket, NP.  ANESTHESIA:  General.  ESTIMATED BLOOD LOSS:  Minimal.  IV FLUIDS:  Per Anesthesia.  URINE OUTPUT:  Not recorded.  COMPLICATIONS:  None.  SPECIMENS:  None.  CLINICAL INDICATION:  [name] is a 50 year old female with a right leg wound.  I previously debrided her wound and placed a VAC dressing 2 days ago.  She was being returned to the operating room for a planned debridement and VAC change under general anesthesia.  She understands the risks of surgery, which included but not to limited to bleeding, infection, damage to nearby structures, stiffness, scarring, and the need for more surgery.  She desires to proceed.  DESCRIPTION OF OPERATION:  The patient was brought to the operating room, placed in the supine position on the operating room table.  After smooth and routine induction of general anesthesia with laryngeal mask airway, the right lower extremity is prepped with Betadine scrub and paint draped into a sterile field.  Surgical time-out was performed. Her VAC dressing had been removed revealing a great increase in the amount of granulation tissue.  There is no purulence, however, there was some malodor to the wound under the VAC dressing.  There is no erythema. The wound was inspected and the superomedial  portion was found to be elevated and with a questionable blood supply.  This was removed with tenotomy scissors including a piece approximately 2 cm x 12 cm towards the apex of the wound superiorly on its medial aspect.  Next, the wound was irrigated with 3 liters of normal saline via pulse lavage and hemostasis verified with Bovie electrocautery.  Next, it was irrigated with bacitracin containing irrigation and a VAC dressing was applied and set to 125 mm of continuous suction with excellent seal.  Next, a dry sterile dressing including a Kerlix, ABD pad, and Ace wrap was applied from the toes to the knee.  The patient tolerated the procedure well, was extubated and transported to the recovery room in stable condition.     Loreta Ave, MD     CF/MEDQ  D:  11/06/2010  T:  11/06/2010  Job:  [id number]  Electronically Signed by Loreta Ave MD on 11/12/2010 02:17:25 PM

## 2010-11-12 NOTE — Op Note (Signed)
[name], [name]               ACCOUNT NO.:  [id number]  MEDICAL RECORD NO.:  [id number]           PATIENT TYPE:  I  LOCATION:  6743                         FACILITY:  [facility]  PHYSICIAN:  Loreta Ave, MD DATE OF BIRTH:  [birth year]  DATE OF PROCEDURE:  11/04/2010 DATE OF DISCHARGE:                              OPERATIVE REPORT   PREOPERATIVE DIAGNOSIS:  Right leg wound, complicated.  POSTOPERATIVE DIAGNOSIS:  Right leg wound, complicated.  PROCEDURE PERFORMED: 1. Debridement of right leg wound including skin and subcutaneous     tissue. 2. Vac placement, greater than 50 sq cm.  SURGEON:  Loreta Ave, MD.  ANESTHESIA:  General.  ASSISTANT:  Tonye Becket, NP.  IV FLUIDS:  Per Anesthesia.  URINE OUTPUT:  Not recorded.  ESTIMATED BLOOD LOSS:  30 mL.  COMPLICATIONS:  None.  CLINICAL INDICATION:  [name] is a 50 year old female who recently had a right lower leg hematoma, which caused skin loss.  She has been admitted to [facility] for management of deep venous thrombosis and placement of IVC filter.  The deep venous thrombosis is in her right leg.  After losing skin on the anterior aspect of the right leg, this was then debrided and she has been undergoing hydrotherapy.  She presents at this time for formal surgical debridement and placement of a Vac dressing.  Rossy understands the risks of surgery to include, but not be limited bleeding, infection, damage to nearby structures, wound healing complications as well as the need for more surgery.  She desires to proceed.  DESCRIPTION OF OPERATION:  The patient was brought to the operating room, placed in supine position on the operating room table.  After smooth and routine induction of general anesthesia with laryngeal mask airway, the surgical time-out was performed.  The patient was given 1 gram of Ancef.  Next, her right leg had its dressing removed and the wound was measured as being  16 x 22 cm.  There is no exposed periosteum. This wound was located over the anterior aspect of the right tibia. There is no surrounding erythema.  No purulence.  However, there was a blue hue to the dressing suggesting Pseudomonas colonization of the surface.  The right lower extremity was prepped with chlorhexidine, draped into a sterile field.  Next, a curette was used to remove nonviable fat and thrombosed superficial vessels.  Next, approximately 3 mm of surrounding skin was excised with a Metzenbaum scissors.  Next, hemostasis was obtained with Bovie electrocautery and the wound was pulse lavaged with 3 liters of normal saline.  It was then irrigated with 500 mL of antibiotic solution and hemostasis again verified.  Next, the Vac dressing was placed with good seal and the leg was wrapped with Kerlix and a compressive Ace wrap at the level of the knee.  Next, the patient was extubated and transferred to the recovery room in stable condition.  Sponge and needle counts reported as correct x2.     Loreta Ave, MD     CF/MEDQ  D:  11/04/2010  T:  11/04/2010  Job:  [id number]  Electronically Signed by  Loreta Ave MD on 11/12/2010 02:17:23 PM

## 2010-11-13 ENCOUNTER — Inpatient Hospital Stay (HOSPITAL_COMMUNITY): Payer: BC Managed Care – PPO

## 2010-11-13 LAB — CBC
HCT: 25.3 % — ABNORMAL LOW (ref 36.0–46.0)
RDW: 16.9 % — ABNORMAL HIGH (ref 11.5–15.5)
WBC: 14.4 10*3/uL — ABNORMAL HIGH (ref 4.0–10.5)

## 2010-11-13 LAB — BASIC METABOLIC PANEL
GFR calc non Af Amer: 19 mL/min — ABNORMAL LOW (ref >60)
Glucose, Bld: 125 mg/dL — ABNORMAL HIGH (ref 70–99)
Potassium: 3.9 mEq/L (ref 3.5–5.1)
Sodium: 134 mEq/L — ABNORMAL LOW (ref 135–145)

## 2010-11-13 LAB — URINE CULTURE
Colony Count: 50000
Culture  Setup Time: 201206061749

## 2010-11-13 LAB — GLUCOSE, CAPILLARY
Glucose-Capillary: 126 mg/dL — ABNORMAL HIGH (ref 70–99)
Glucose-Capillary: 134 mg/dL — ABNORMAL HIGH (ref 70–99)
Glucose-Capillary: 142 mg/dL — ABNORMAL HIGH (ref 70–99)

## 2010-11-13 LAB — MAGNESIUM: Magnesium: 1.7 mg/dL (ref 1.5–2.5)

## 2010-11-13 LAB — PHOSPHORUS: Phosphorus: 4 mg/dL (ref 2.3–4.6)

## 2010-11-14 LAB — GLUCOSE, CAPILLARY
Glucose-Capillary: 154 mg/dL — ABNORMAL HIGH (ref 70–99)
Glucose-Capillary: 154 mg/dL — ABNORMAL HIGH (ref 70–99)

## 2010-11-14 LAB — CBC
HCT: 25.9 % — ABNORMAL LOW (ref 36.0–46.0)
Hemoglobin: 8.2 g/dL — ABNORMAL LOW (ref 12.0–15.0)
MCH: 27.9 pg (ref 26.0–34.0)
MCHC: 31.7 g/dL (ref 30.0–36.0)

## 2010-11-14 LAB — RENAL FUNCTION PANEL
BUN: 67 mg/dL — ABNORMAL HIGH (ref 6–23)
Calcium: 9 mg/dL (ref 8.4–10.5)
Glucose, Bld: 91 mg/dL (ref 70–99)
Phosphorus: 5.1 mg/dL — ABNORMAL HIGH (ref 2.3–4.6)
Potassium: 3.1 mEq/L — ABNORMAL LOW (ref 3.5–5.1)

## 2010-11-14 LAB — CROSSMATCH
ABO/RH(D): O POS
Unit division: 0
Unit division: 0

## 2010-11-14 LAB — MAGNESIUM: Magnesium: 2.4 mg/dL (ref 1.5–2.5)

## 2010-11-14 NOTE — Group Therapy Note (Signed)
[name], [name] NO.:  [id number]  MEDICAL RECORD NO.:  [id number]  LOCATION:  6743                         FACILITY:  [facility]  PHYSICIAN:  Hollice Espy, M.D.DATE OF BIRTH:  [birth year]                                PROGRESS NOTE   ADMITTING PHYSICIAN: Kathlen Mody, MD, [facility].  CONSULTANTS DURING THIS ADMISSION: 1. Mont Dutton, MD, Neurology. 2. Duke Salvia. Eliott Nine, MD, Nephrology. 3. Loreta Ave, MD, Plastic Surgery.  CHIEF COMPLAINT/REASON FOR ADMISSION: [name] is a 50 year old female patient well known to [facility] from recent acute care stay several days prior.  She had recently been transferred to the [name] after a complicated hospital course beginning in early May 2012.  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  She has had 2 renal transplant procedures and is on immunosuppression medication for this.  She was admitted initially on May 2 due to progressive altered mental status, lethargy, and acute kidney failure as well as leukocytosis.  It was later found that she was septic from urinary tract infection as well as recalcitrant C. difficile colitis.  She had a complicated hospitalization, was also found to have bacteremia which required broad-spectrum antibiotics and  critical care support involving pressor agents.  Hospital course was further complicated by an acute right lower extremity DVT.  She later developed a retroperitoneal hematoma due to the heparin therapy utilized to treat this.  Vascular service was consulted and she had an IVC filter placed.  Of note, more recently prior to transfer to [facility] the patient had issues with toxic metabolic encephalopathy related to sundowning syndrome and her progressive kidney disease.  She also had issues with atrial fibrillation with rapid ventricular response and was  having difficulty with symptomatic orthostasis.  Medications had been carefully selected and adjusted to achieve appropriate rate control without developing further hypotension.  She was subsequently discharged to [facility] on Oct 23, 2010. Apparently she began having increasing lower extremity pain on the right, in the same leg she had a DVT and was noted to have a medium-size hematoma present.  She apparently had been receiving quite a bit of pain medication because of this progressive leg discomfort.  She had an episode of unresponsiveness and the staff  was concerned that the patient may be postictal from a seizure, so she was subsequently transferred back to [name] and readmitted on Oct 25, 2010.  Please refer to Dr. Tamsen Meek and P for additional details regarding vital signs and findings at presentation.  Please note that at presentation to the ER the patient's temperature was 97.7, BP was 149/84, pulse 60 and regular, respirations 18, and O2 sat 100% on room air.  On physical exam, the patient was arousable, lethargic, but responded to verbal commands, no other significant findings differing from prior to transfer to [facility].  There were no focal neurological deficits.  She was able to move all extremities.  Laboratory values were significant for leukocytosis of 16,200, hemoglobin 9.2, platelets 171,000, creatinine 3.61 which is at baseline and BUN 96 which was at baseline.  ADMITTING DIAGNOSES.: 1.  Acute altered mentation/toxic metabolic encephalopathy/rule out     acute seizure disorder. 2. Diagnosis of atrial fibrillation with mild rapid ventricular     response.  Initial heart rates in the 110s. 3. Stage IV chronic kidney disease, history of renal transplant on     immunosuppressive therapy. 4. Systemic lupus erythematosus. 5. Right lower extremity deep venous thrombosis and evolving hematoma     with increasing leg pain. 6. Hypertension currently controlled. 7.  Chronic anemia.  Hemoglobin stable at baseline between 8 to 9. 8. Leukocytosis of uncertain etiology.  The patient afebrile and on     steroids.  DIAGNOSTICS: 1. Portable chest x-ray on May 19 shows improved bilateral airspace     disease with a small right pleural effusion. 2. CT of the head no contrast on May 20, shows progression of atrophy.     No acute abnormality. 3. Portable chest x-ray on May 21 shows stable right basilar airspace     opacity likely reflecting combination of pleural fluid and     atelectasis.  No significant change from recent prior study aside     from mildly improved lung expansion. 4. MRI of the brain without contrast on May 21, that shows no acute     infarct.  Global atrophy without hydrocephalus.  Very mild     nonspecific white matter type changes probably related to small     vessel disease.  Right mastoid air cell opacification.  Paranasal     sinus mucosal thickening. 5. Portable chest x-ray on June 2, which shows continued atelectasis     or consolidation of the right lung base not significantly changed.     Interval improvement in ventilation at the left lung base.  Stable     cardiomegaly without acute pulmonary edema.  There is a portable     chest x-ray pending for today June 5, that has not been yet     completed.  LABORATORY DATA: Blood cultures obtained on May 22, showed no growth.  Prealbumin May 23 is 26.  TSH from May 21 is 31.149 with free T4 1.09, and T3 1.6.  Urine culture on May 24 shows no growth.  Ammonia level on May 21, 17.  Iron level on June 3, 91 with UIBC less than 55, ferritin 1420.  Most recent laboratory findings from June 5, sodium 145, potassium 2.5, chloride 107, CO2 of 23, glucose 95, BUN 70, creatinine 3.06, white count is 14,600, hemoglobin 7.3, hematocrit 23.6, and platelets 224,000.  HOSPITAL COURSE: 1. Acute altered mentation with toxic metabolic encephalopathy.  After     presenting to [name] from [facility],  Neurology was     consulted.  The neurologist's felt the patient did not have a seizure.  She     has had no acute changes on her CT or MRI of the head.  The patient     has had symptomatic orthostasis in the past with similar symptoms     prior to being admitted to the [facility] and we feel orthostasis is the     etiology of her acute altered mentation in association with     excessive pain medications due to evolving severe right lower     extremity pain.   2.  Acute renal failure in renal transplant     patient, chronic kidney disease stage IV.  The patient's new     baseline creatinine is anywhere between 3.5 and 3.7.  Renal has  been following.  In the past, she has been on very high dose Lasix     160 mg every 6 hours before moving to [facility].  Of late, she has     been on Lasix 120 p.o. b.i.d. but due to anemia and hypokalemia,     Renal has placed this medication on hold as of  November 10, 2010.     She remains on immunosuppressant therapy with prednisone and     Myfortic. 3. Atrial flutter with rapid ventricular response.  The patient     remains in atrial flutter, but rate is controlled.  She is on     Lopressor and Cardizem. 4. Right lower extremity DVT and hematoma.  After returning from     [facility], the patient was noted to have significant increase in size     of the right lower extremity hematoma with significant pain and     swelling.  Wound care and Plastic surgery has been following.  Dr.     Noelle Penner, eventually decided that I and D of the area was indicated.     Initially a bedside I and D was performed with no significant     improvement in symptomatology.  Therefore, the patient was     subsequently taken on different days to the OR for two additional     debridements and then yesterday on November 09, 2010, the patient     underwent split thickness skin grafting to same area.  She has a     wound VAC in place with an Ace wrap.  The harvest site on the right     thigh is  stable, although she has had some red blood clot noted     beneath the dressing.  She is on Zosyn per Plastic surgery orders     postoperatively. 5. Acute blood loss anemia and chronic anemia.  The patient's baseline     hemoglobin at initial presentation on May 6 was 9.5 and prior to     transfer to [facility] was between 8.8 and 9.2.  Over the past several     days her hemoglobin has trended downward, today it is 7.3.     Clinically, she looks like she may be symptomatic from this anemia.     This is further complicated by the fact that the patient is     probably at least euvolemic and potentially dry giving a falsely     elevated hemoglobin.  She is also somewhat clamped down and greyish     in appearance and extremities are cool to the touch.  Therefore, I     have opted to transfuse 2 units of packed red blood cells today     very slowly each unit every 4 hours.  We will check a CBC in the     morning. 6. Chronic systolic heart failure.  At the present time, the heart     failure appears to be compensated.  As noted her Lasix was placed     on hold today by Renal.  Because she is receiving blood, I will at     least give 120 mg of p.o. Lasix today after the first unit of blood     avoid any volume shifting and overload from the blood. 7. Hypokalemia.  The patient has persistent issues with hypokalemia     over the past week.  Renal has been supplementing with oral     potassium repletion, but we  have not unable to get the potassium     consistently over 3.0.  Today's potassium is 2.9.  As noted, Lasix     has been placed on hold and she has been given two doses of     potassium today. 8. Profound deconditioning.  At present time, the patient is still not     ambulating due to issues related to the right lower extremity     surgery and pain.  At this point, I am not sure the patient will     ever be able to regain ambulatory status.  It is unclear at this     time that the patient will  discharge home and just remain on     bedrest for the remainder of her life.  Comprehensive inpatient     rehabilitation is a consideration but given the patient's inability at     this point to participate in meaningful physical therapy it is     doubtful that this will be an option for her.  Skilled nursing     facility is most likely option, but doubt the patient nor the     husband will agree to this.  In reality this patient due to     multiple comorbidities would benefit from a palliative care consult     for goals of care.  We have attempted this in the past but the     patient and her husband were not emotionally ready to address end-     of-life issues.  DISPOSITION: At the present time, the patient is not appropriate for discharge home and expect additional prolonged hospital stay.  She has remained stable from a postoperative standpoint.  Team eight has been following her on the renal floor for about 2 weeks now.  We feel this point she is stable enough to transfer to one of the regular rounding teams.  We have assigned her to Team seven.  I have discussed this with the patient, so she will be aware of the physician change.     Allison L. Rennis Harding, N.P.   ______________________________ Hollice Espy, M.D.    ALE/MEDQ  D:  11/10/2010  T:  11/10/2010  Job:  [id number]  Electronically Signed by Junious Silk N.P. on 11/11/2010 09:33:14 AM Electronically Signed by Virginia Rochester M.D. on 11/14/2010 01:23:26 PM

## 2010-11-15 DIAGNOSIS — A419 Sepsis, unspecified organism: Secondary | ICD-10-CM

## 2010-11-15 LAB — CBC
HCT: 25.9 % — ABNORMAL LOW (ref 36.0–46.0)
MCH: 28.1 pg (ref 26.0–34.0)
MCHC: 32 g/dL (ref 30.0–36.0)
MCV: 87.8 fL (ref 78.0–100.0)
RDW: 16.9 % — ABNORMAL HIGH (ref 11.5–15.5)

## 2010-11-15 LAB — GLUCOSE, CAPILLARY
Glucose-Capillary: 111 mg/dL — ABNORMAL HIGH (ref 70–99)
Glucose-Capillary: 95 mg/dL (ref 70–99)
Glucose-Capillary: 192 mg/dL — ABNORMAL HIGH (ref 70–99)

## 2010-11-15 LAB — COMPREHENSIVE METABOLIC PANEL
Albumin: 2 g/dL — ABNORMAL LOW (ref 3.5–5.2)
BUN: 69 mg/dL — ABNORMAL HIGH (ref 6–23)
Calcium: 9.1 mg/dL (ref 8.4–10.5)
Creatinine, Ser: 2.8 mg/dL — ABNORMAL HIGH (ref 0.4–1.2)
Potassium: 4.3 mEq/L (ref 3.5–5.1)
Total Protein: 4.6 g/dL — ABNORMAL LOW (ref 6.0–8.3)

## 2010-11-15 LAB — MAGNESIUM: Magnesium: 2.1 mg/dL (ref 1.5–2.5)

## 2010-11-15 LAB — PHOSPHORUS: Phosphorus: 5.2 mg/dL — ABNORMAL HIGH (ref 2.3–4.6)

## 2010-11-16 LAB — BASIC METABOLIC PANEL
BUN: 72 mg/dL — ABNORMAL HIGH (ref 6–23)
CO2: 22 mEq/L (ref 19–32)
Calcium: 9.4 mg/dL (ref 8.4–10.5)
Chloride: 94 mEq/L — ABNORMAL LOW (ref 96–112)
Creatinine, Ser: 2.58 mg/dL — ABNORMAL HIGH (ref 0.4–1.2)

## 2010-11-16 LAB — CBC
HCT: 27.2 % — ABNORMAL LOW (ref 36.0–46.0)
MCH: 28.2 pg (ref 26.0–34.0)
MCV: 88 fL (ref 78.0–100.0)
Platelets: 165 10*3/uL (ref 150–400)
RBC: 3.09 MIL/uL — ABNORMAL LOW (ref 3.87–5.11)
RDW: 16.9 % — ABNORMAL HIGH (ref 11.5–15.5)
WBC: 20.8 10*3/uL — ABNORMAL HIGH (ref 4.0–10.5)

## 2010-11-16 LAB — GLUCOSE, CAPILLARY
Glucose-Capillary: 191 mg/dL — ABNORMAL HIGH (ref 70–99)
Glucose-Capillary: 195 mg/dL — ABNORMAL HIGH (ref 70–99)

## 2010-11-16 LAB — TSH: TSH: 35.387 u[IU]/mL — ABNORMAL HIGH (ref 0.350–4.500)

## 2010-11-17 ENCOUNTER — Encounter: Payer: Self-pay | Admitting: Internal Medicine

## 2010-11-17 LAB — CBC
MCH: 28.3 pg (ref 26.0–34.0)
MCHC: 31.6 g/dL (ref 30.0–36.0)
Platelets: 103 10*3/uL — ABNORMAL LOW (ref 150–400)
RDW: 17 % — ABNORMAL HIGH (ref 11.5–15.5)

## 2010-11-17 LAB — RENAL FUNCTION PANEL
Albumin: 2.1 g/dL — ABNORMAL LOW (ref 3.5–5.2)
Calcium: 8.8 mg/dL (ref 8.4–10.5)
GFR calc Af Amer: 24 mL/min — ABNORMAL LOW (ref >60)
GFR calc non Af Amer: 20 mL/min — ABNORMAL LOW (ref >60)
Phosphorus: 4.9 mg/dL — ABNORMAL HIGH (ref 2.3–4.6)
Potassium: 3.1 mEq/L — ABNORMAL LOW (ref 3.5–5.1)
Sodium: 131 mEq/L — ABNORMAL LOW (ref 135–145)

## 2010-11-17 LAB — CULTURE, BLOOD (ROUTINE X 2)

## 2010-11-17 LAB — GLUCOSE, CAPILLARY
Glucose-Capillary: 108 mg/dL — ABNORMAL HIGH (ref 70–99)
Glucose-Capillary: 243 mg/dL — ABNORMAL HIGH (ref 70–99)

## 2010-11-17 NOTE — Group Therapy Note (Signed)
NAMECHARDONNAY, HOLZMANN NO.:  [id number]  MEDICAL RECORD NO.:  [id number]  LOCATION:  2111                         FACILITY:  [facility]  PHYSICIAN:  Conley Canal, MD      DATE OF BIRTH:  Jan 31, 1961                                PROGRESS NOTE   This progress note covers the events from June 6 to the present.  Please referred to Dr. Chancy Milroy progress note on June 5 for other details.  PROBLEM LIST: 1. Atrial fibrillation with rapid ventricular response.  The patient     not anticoagulated because of left leg hematoma requiring multiple     surgical procedures. 2. C difficile colitis. 3. Stage IV chronic kidney disease with prior renal transplant, on     chronic immunosuppression. 4. Systemic lupus erythematosus. 5. Recently treated Pseudomonas urinary tract infection. 6. Right lower extremity hematoma related to anticoagulation. 7. Right lower extremity DVT, status post IVC filter. 8. Acute retroperitoneal bleed secondary to anticoagulation.  CONSULTING PHYSICIANS: 1. Dr. Gala Romney. 2. [facility]. 3. [employer].  PROCEDURES PERFORMED SINCE November 11, 2010: Chest x-rays from June 6 to June 8 showing no change in right basilar opacity, most consistent with atelectasis and effusion.  HOSPITAL COURSE: [name] has been hospitalized since May 20 after being referred from [facility] with history of unresponsiveness and concern for possible seizures.  Since then she has had a complicated hospital stay as comprehensively laid out by Dr. Rito Ehrlich on May 5.  She had to be readmitted to the ICU on June 6 because she was desaturating and was more lethargic.  She was found to be in acute respiratory failure. Pulmonary and Critical Care assumed her care on June 6.  She did not require ventilator support but the antibiotics were changed to cefepime, Zyvox, Flagyl, vancomycin p.o.  She completed the course of cefepime  and Zyvox on June 13 and the course of Flagyl and vancomycin p.o. on June 20.  She was followed by Cardiology for atrial fibrillation with rapid ventricular response, and her rate control has improved to ventricular rate of low 100.  She is currently on Lopressor, Cardizem.  She is not a Coumadin candidate because of recent hematoma.  Today, she feels better. Still coughing.  PHYSICAL EXAMINATION: VITAL SIGNS:  Blood pressure 156/92, heart rate around 100.  She is afebrile, oxygenating adequately. HEAD, EARS, NOSE, AND THROAT:  Pale.  Pupils equal and reacting to light.  No jugular venous distention.  No carotid bruits. RESPIRATORY:  Bilateral air entry.  Occasional cough.  Basilar rhonchi. CARDIOVASCULAR:  First and second heart sounds heard.  Irregular tachycardia. ABDOMEN:  Soft, nontender.  No palpable organomegaly. CNS:  Grossly intact. EXTREMITIES:  Right leg in immobilizer, more swollen compared to left.  LABORATORY DATA: Labs were reviewed, significant for WBC 18,000, hemoglobin 8.3, hematocrit 25.9, platelet count 165.  Sodium 132, potassium 4.3, BUN 69, creatinine 2.8, calcium 9.1, glucose 122, alkaline phosphatase 212. Urine culture on June 6 grew Pseudomonas.  PLAN: 1. Atrial fibrillation with rapid ventricular response.  Cardiology     input appreciated.  Continue to optimize rate control.  As long as  BP tolerating, there is room for Cardizem and Lopressor. 2. Pseudomonas UTI.  The patient is on cefepime since the 6th to     complete course on June 13. 3. C difficile colitis.  The patient is on p.o. vanc and Flagyl to     complete course on June 20. 4. Right lower lobe pneumonia, community-acquired.  The patient is on     cefepime and Zyvox, completes course on June 13. 5. Right leg DVT, status post IVC filter.  The patient is not an     anticoagulation candidate due to recent hematoma. 6. Right leg wound.  Followed by Plastics and Orthopedics. 7. CKD, stage  IV.  Renal appreciated.  The patient is status post     renal transplant on immunosuppressants. 8. Disposition:  Plan to transfer to [facility]00 when bed becomes available.     Will need long-term care postop hospitalization.     Conley Canal, MD     SR/MEDQ  D:  11/15/2010  T:  11/15/2010  Job:  [id number]  Electronically Signed by Conley Canal  on 11/17/2010 07:39:46 PM

## 2010-11-17 NOTE — Discharge Summary (Signed)
[name], [name] NO.:  [id number]  MEDICAL RECORD NO.:  [id number]           PATIENT TYPE:  I  LOCATION:  2926                         FACILITY:  [facility]  PHYSICIAN:  Lonia Blood, M.D.DATE OF BIRTH:  09-19-1960  DATE OF ADMISSION:  10/07/2010 DATE OF DISCHARGE:  10/23/2010                        DISCHARGE SUMMARY - REFERRING   ADMITTING PHYSICIAN:  Eduard Clos, MD  DISCHARGING PHYSICIAN:  Dr. Jetty Duhamel, [facility].  PRIMARY CARDIOLOGIST:  Bevelyn Buckles. Bensimhon, MD  CONSULTANTS DURING THIS HOSPITALIZATION: 1. Dr. Fabienne Bruns with Vascular Services. 2. Dr. Zetta Bills with Nephrology Services.  PRIMARY NEPHROLOGIST:  Dr. Marina Gravel.  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  She was recently admitted in April 2012 due to abnormal findings on the right lung on chest CT, possibilities of ammonia.  She was also treated for C.difficile colitis at that time.  She was readmitted on Oct 07, 2010 with progressive altered mental status, lethargy and renal failure.  She was found to have leukocytosis and progressive renal failure as well as an anion gap metabolic acidosis.  Urinalysis was consistent with a UTI. No apparent diarrhea prior to admission.  Due to some changes that were concerning for possible infectious process, the nephrologist obtained a urine culture on May 1 that revealed greater than 100,000 colonies of Pantoea agglomerans.  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  This had findings that were within normal limits.  Since admission she was found to have a Pseudomonas urinary tract infection as well as Pseudomonas bacteremia.   She has been treated with cefepime and Levaquin this admission.  Her hospital course was further complicated by acute right lower extremity DVT for which she was placed on IV heparin. She later developed a retroperitoneal hematoma.  Vascular Services was consulted and an IVC filter was placed.  She was also found to be on progressive acute renal failure, but was not rejecting her kidney. Renal Team has been following this admission.  On May 12, [facility] assumed care of this patient.  PAST MEDICAL HISTORY: 1. Chronic kidney disease stage IV with prior renal transplant     medications. 2. Chronic immunosuppression, on antirejection medications. 3. History of atrial fibrillation and flutter, not a Coumadin     candidate secondary to prior Coumadin related hematoma of the left     leg which required multiple surgical procedures. 4. 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 5. History of recent C.difficile colitis. 6. History of recent pneumonia. 7. Known systemic lupus erythematosus. 8. 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  ADMITTING DIAGNOSES: 1. Toxic metabolic encephalopathy presumed related to multiple     etiologies including infectious process, metabolic acidosis, and     acute renal failure. 2. Systemic inflammatory response syndrome. 3. Presumed healthcare-acquired pneumonia, organism unclear. 4. Acute renal failure, on stage IV chronic kidney disease. 5. Renal transplant  patient x2, on chronic immunosuppression     medications. 6. Presentation with nausea and vomiting symptoms in setting of recent     C.difficile colitis. 7. Chronic right lower extremity edema. 8. History of prior hematoma of the left lower extremity secondary to     Coumadin anticoagulation requirement for surgical procedures. 9. History of atrial fibrillation and flutter with rate controlled at     present time. 10.Compensated  diastolic heart failure with preserved systolic     function. 11.History of lupus, currently not exacerbated. 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  DIAGNOSTICS:  Please note that the patient has been hospitalized for protracted amount of time and I will only discuss pertinent diagnostic procedures done this admission. 1. Two-view chest x-ray on May 2 that shows progression of right lower     lobe air space disease and pneumonia. 2. CT of the abdomen and pelvis on May 3 that shows stable     cholelithiasis with increased gallbladder distention suspected due     to n.p.o. status.  No acute inflammatory changes are seen.  Also     was noted to have abnormal positioning of the IUD in the lower     uterine segment.  New bilateral lower lobe air space disease     consistent with pneumonia.  There was also increased number of     healing left rib fractures. 3. Portable abdominal ultrasound on May 4, which showed cholelithiasis     with gallbladder distention and mild common bile duct dilatation.     No definite bile duct stone obstructing process.  This can be     further evaluated with MRCP, it is felt clinically indicated. 4. Followup CT of the abdomen and pelvis on May 8, that demonstrated     15-cm right retroperitoneal bleed.  Posterior right lower lobe air     space disease.  New body wall edema and bilateral pleural     effusions.  Cholelithiasis is stable.  Possible right lower     extremity DVT correlate with ultrasonographic findings. 5. Bilateral venous duplex lower extremity Dopplers from May 4,     showing no DVT  in the left lower extremity, but findings were     consistent with acute DVT involving the right lower extremity. 6. Bilateral lower extremity arterial Dopplers on May 4, findings     consistent with inability to ascertain true brachial and pedal     pressures due to calcification of arterial  walls.  However,     triphasic wave forms are found throughout and good toe pressures     were noted which is suggestive of normal arterial flow. 7. 2-D echocardiogram on May 4, that shows diffuse hypokinesis of the     left ventricle which is worse in the septum and apex.  The cavity     size was moderately dilated.  Wall thickness was increased in a     pattern of mild LVH.  Systolic function mildly reduced with an     estimated ejection fraction of 45-50% with associated diffuse     hypokinesis.  Mitral valve demonstrated severe MAC and bulky     calcification of the mitral annulus.  This was consistent with     moderate stenosis and mild-to-moderate regurgitation, valve area by     pressure half time is 2.08 cm2.  The left atrium was severely     dilated and  the right atrium was mildly dilated.  The tricuspid     valve demonstrated moderate regurgitation with associated mild-to-     moderate pulmonary hypertension measuring 47 mmHg. 8. Portable chest x-ray on May 10 that shows stable right internal     jugular venous catheter in place.  No pneumothorax.  Stable     enlargement of the cardiac silhouette.  Stable appearance of     cardiac calcifications.  Atelectasis and infiltrate in the right     lower lungs.  Patchy infiltrate of density in the right mid lung     zone which was new.  This has possibly more reflective of     atelectasis.  LABORATORY:  On date of admission, lactic acid was 1.0.  Cardiac isoenzymes were cycled x3 and showed no evidence of acute ischemia. Procalcitonin within 24 hours of admission was 33.7, C3 complement was 100, and C4 complement was 31.  TSH was 7.511.  Urine culture from May 2 was positive for greater than 100,000 colonies of Pseudomonas aeruginosa, intermediate sensitivity to ceftazidime, otherwise pansensitive.  Blood cultures from May 2 positive for Pseudomonas with the same sensitivity profile as the urine culture.  Iron was 72, TIBC and  percent sat were not calculated.  UIBC was less than 55.  Serum ferritin was 2428.  Prealbumin on May 14 was 24.8.  Stool for C.diff on May 15 was negative. Repeat urine culture on May 16 shows no growth.  No additional blood cultures have been obtained since completion of antibiotic therapy.  Parathyroid hormone panel was obtained, intact parathyroid was 489 with total calcium being 8.4.  Most recent laboratory as of May 18, white count is down to 11,300, hemoglobin 8.5, hematocrit 26.2, platelets 132,000, and phosphorus 6.1.  Sodium is 138, potassium 4.0, chloride 104, CO2 of 23, glucose 110, BUN 96, creatinine 3.52, total bilirubin 0.5, alkaline phosphatase 185, AST 10, and ALT 13.  HOSPITAL COURSE: 1. Sepsis without hypotension.  The patient presented clinical signs     consistent with a septic state.  She was later found to have Pseudomonas     UTI and bacteremia.  At the present time sepsis state has resolved. 2. Pseudomonas urinary tract infection with bacteremia.  The patient     was treated empirically with broad-spectrum antibiotics and later     narrowed to cefepime once cultures were finalized, cefepime was     discontinued on May 15 after 14 days of treatment.  Transthoracic     echocardiogram showed no evidence of endocarditis. 3. Recurrent C. difficile colitis.  The patient was admitted on September 14, 2010 for C.diff colitis and was treated with Flagyl for 10 days     after discharge. Oral vancomycin was completed on May 15.  Stool     for C.diff was negative on May 15. 4. Acute delirium with toxic metabolic encephalopathy.  The     etiology of these problems were multifactorial due to associated     metabolic acidosis and acute infection on  admission.  She has     recently been having trouble with evening delirium most likely     related to protracted stay in the ICU/step-down setting as well as     related to some of medications she has been receiving.  She also      had issues with orthostatic hypotension and this may have     contributed to these problems as well.  At the present  time, we     will continue to follow her symptoms since they are very mild and     the patient is aware of them and there were no indications at this     point for psychiatric consultation. 5. Acute renal failure in a renal transplant patient with underlying     chronic kidney disease stage IV.  Renal Services has been following     this admission.  The patient's new baseline creatinine is between     3.5 and 3.7 with a creatinine clearance of about 19.  Over the past     several days the patient had been on high-dose IV Lasix,     subsequently changed over to Lasix 160 mg p.o. q.6 h.  This dosage     was discontinued on May 16 due to suspicion the patient may be     experiencing on volume depletion.  She was noted to have symptomatic     orthostatic hypotension with complaints of feeling like she was     outside of her body with a systolic blood pressure of 89, went up     in the chair.  The patient has had associated hyponatremia which     has slowly improved with stabilization of renal function and as of     today, May 18 sodium is 138. 6. Atrial fibrillation with rapid ventricular response.  The patient     initially developed atrial flutter with rapid RVR, suspect related     to underlying volume depletion and sepsis syndrome with her heart     rate up.  She also had hypotension.  The patient has underlying     atrial fib and flutter and is not a Coumadin candidate because of prior     compartment syndrome that required emergent surgical intervention.     Over the past 48 hours the patient has redeveloped atrial flutter.     Suspect this was partially due to recent changes in Lopressor     dosages.  The Lopressor dose was markedly decreased on May 16 due     to the patient's symptomatic orthostasis and has been slowly been     re-titrated back up.  Over the past 12  hours she has been on IV     Cardizem drip due to heart rate peaking as high as 150s.  Dr.     Gala Romney, Cardiology, has been following this hospitalization.  At     time of dictation, he is recommended to stop the IV Cardizem drip     and instead begin Cardizem p.o. 360 mg daily.  He agrees with our     current plan of increasing Lopressor back to 150 mg p.o. b.i.d.     Because of her low creatinine clearance, digoxin was not     recommended for this patient.  Please note her echocardiogram shows     severe left atrial dilatation. 7. Acute right lower extremity DVT with associated acute     retroperitoneal bleeding due to anticoagulation therapy.  The     patient was found to have right lower extremity DVT and was started     empirically on IV heparin.  Later she developed signs and symptoms     consistent with possible retroperitoneal bleed.  CT of the abdomen     was obtained and confirmed a 15-cm retroperitoneal bleed.     Subsequently Vascular Services was consulted and the patient     underwent  placement of an IVC filter, so anticoagulation could be     discontinued.  The patient has extensive right lower extremity     edema and weeping.  Dr. Noelle Penner with W[facility] Care Services has been     followed this patient.  Current therapy is ACE wrap with Mepilex     Border placed on Monday, Wednesday, and Friday. 8. Persistent leukocytosis.  At presentation, the patient's white     count was markedly elevated at 28,600 and with initiation of     antibiotic therapy the patient's white count initially began to     decrease.  By May 8, around the same time the patient was diagnosed     with retroperitoneal bleeding and right lower extremity DVT her     white count increased to a peak of 36,600 and as of today, May 18     is down to 11,300.  She is completed all antibiotics therapy in     regards to treatment of the Pseudomonas bacteremia and UTI and for     C.diff colitis. 9. Acute blood loss  anemia and chronic anemia.  The patient has a     baseline hemoglobin between 8-10.  With the associated     retroperitoneal bleeding, the patient's hemoglobin dropped into the     6 range with nadir of 5.2.  She subsequently received 3 units of     packed red blood cells this admission as well as 1 unit of FFP to     reverse the heparin anticoagulation.  From more chronic standpoint     regarding her anemia, Renal Services has been following and she is     now on Aranesp therapy. 10.Mild hypoxemic respiratory failure.  Initially it was felt the     patient possibly had a hospital-acquired pneumonia from previous     admission.  Pulmonary Critical Care Medicine followed the patient     during initial portion of her hospitalization.  They felt that her     infiltrates are chronic and were not indicative of an acute     infective process.  Currently, she is stable on room air. 11.Mild thrombocytopenia.  The patient presented with normal platelet     count of 284,000.  Since experiencing a retroperitoneal bleeding     and received a blood products.  The patient has had a persistent     thrombocytopenia with a nadir as low as 102,000 as of today,     platelets are up to 232,000.  She is not showing any signs of acute     bleeding.  Please note that the patient does have extensive     discoloration, swelling, and hematoma involving the right lower     extremity and somewhat in the left lower extremity but these areas     are stable.  FINAL DISCHARGE DIAGNOSES: 1. Altered mentation secondary to acute delirium and toxic metabolic     encephalopathy, multifactorial due to presentation with metabolic     acidosis, acute infection, and acute kidney injury. 2. Sepsis and systemic inflammatory response syndrome without     hypotension. 3. Pseudomonas urinary tract infection and Pseudomonas bacteremia,     treatment completed. 4. Recurrent Clostridium difficile colitis, currently Clostridium      difficile negative after treatment. 5. Orthostatic hypotension secondary to volume depletion as well as     antihypertensive medications. 6. Mild hypoxemic respiratory failure, resolved.  No evidence of acute  pneumonic process this admission. 7. Acute renal failure and renal transplant, the patient on chronic     kidney disease stage IV, creatinine stable. 8. Atrial flutter with rapid ventricular rate recurrent. 9. Acute right lower extremity deep venous thrombosis, not a Coumadin     candidate secondary to recurrent bleeding issues. 10.Acute retroperitoneal bleed secondary to anticoagulation. 11.Acute blood loss anemia and chronic anemia secondary to     retroperitoneal bleed, require transfusion this admission. 12.Persistent leukocytosis, improving. 13.Chronic mild systolic heart failure compensated with an ejection     fraction of 55-45%. 14.Hypertension, currently controlled.  Clonidine had been initiated     this admission but was discontinued on May 13 due to hypotension. 15.Hypoalbuminemia with normal prealbumin level. 16.Oral thrush with recent start of Diflucan 100 mg p.o. daily x5 days     on May 18. 17.Abnormal position intrauterine device, seen on CT, physician listed     as stable per radiologist's interpretation.  DISCHARGE MEDICATIONS: 1. Tylenol 650 mg every 4 hours as needed for pain or fever. 2. Xanax 0.25 mg by mouth every 8 hours as needed for anxiety or     restlessness. 3. Cardizem 360 mg p.o. daily. 4. Calcium acetate 667 mg three times a day before meals. 5. Ditropan 100 mg daily for 5 days for oral thrush. 6. Aranesp 200 mcg subcu on Mondays at 6 p.m. 7. Benadryl 25 mg daily at bedtime as needed for insomnia. 8. Fentanyl citrate 50 mcg IV every 2 hours as needed for pain or     fentanyl 50 mcg to 100 mcg IV every 1 hour as needed for severe     pain. 9. Haldol 1 mg IV every 8 hours as needed for agitation. 10.NovoLog sliding scale insulin 1-15 units  subcu t.i.d. with meals. 11.Sliding scale as follows, for CBG 121-150 2 units insulin, for CBG     151-200 3 units subcu insulin, for CBG 201-255 units subcu insulin,     for CBG 251-300 8 units subcu insulin, for CBG 301-350 11 units     subcu insulin, for CBG 351-400 15 units subcu insulin, for CBG     greater than 400 call MD and verify with stat serum glucose level. 12.Xopenex 0.63 mg and 3 mL nebulizer treatment every 4 hours p.r.n.     shortness of breath or wheezing. 13.Metoprolol 100 mg p.o. b.i.d., hold if systolic blood pressure less     than or equal to 95 or heart rate less than or equal to 50. 14.Mechanical VTE prophylaxis, left leg only. 15.Mycophenolate 720 mg b.i.d. 16.Zofran 4 mg IV or p.o. every 6 hours as needed for nausea. 17.Protonix 40 mg p.o. daily. 18.Prednisone 5 mg b.i.d. with meals. 19.Renagel 1600 mg t.i.d. with meals. 20.0.9% normal saline flush 3 mL IV every 12 hoursto the central     line. 21.Thick-It food thickener as needed for thin liquids.  ADDITIONAL DISCHARGE INSTRUCTIONS:  ACTIVITY:  Continue PT, OT and [facility] per their evaluation.  DIET:  Carb-modified medium calorie, Thick-It for thin liquids.  WOUND CARE:  Continue right lower extremity Ace wrap.  Please apply Mepilex Border to posterior aspect of the right calf on Monday, Wednesday, and Friday.  INSTRUCTIONS UNDER WOUND CARE:  Continue right subclavian central line due to severely poor IV access with routine care of site.  Please note this line was inserted on Oct 07, 2010.  DISPOSITION:  At this time, the patient is appropriate to discharge to [facility] to  continue treatment for current long- term acute care needs.     Allison L. Rennis Harding, N.P.   ______________________________ Lonia Blood, M.D.    ALE/MEDQ  D:  10/23/2010  T:  10/23/2010  Job:  [id number]  cc:   Bevelyn Buckles. Bensimhon, MD Wilber Bihari. Caryn Section, M.D.  Electronically Signed by Junious Silk N.P.  on 10/28/2010 08:12:56 AM Electronically Signed by Jetty Duhamel M.D. on 11/16/2010 09:50:40 PM

## 2010-11-18 LAB — RENAL FUNCTION PANEL
CO2: 21 mEq/L (ref 19–32)
Calcium: 9 mg/dL (ref 8.4–10.5)
GFR calc Af Amer: 23 mL/min — ABNORMAL LOW (ref >60)
GFR calc non Af Amer: 19 mL/min — ABNORMAL LOW (ref >60)
Phosphorus: 4.1 mg/dL (ref 2.3–4.6)
Potassium: 3.8 mEq/L (ref 3.5–5.1)
Sodium: 131 mEq/L — ABNORMAL LOW (ref 135–145)

## 2010-11-18 LAB — GLUCOSE, CAPILLARY

## 2010-11-18 LAB — CBC
MCH: 28.5 pg (ref 26.0–34.0)
Platelets: 105 10*3/uL — ABNORMAL LOW (ref 150–400)
RBC: 2.7 MIL/uL — ABNORMAL LOW (ref 3.87–5.11)
WBC: 19.1 10*3/uL — ABNORMAL HIGH (ref 4.0–10.5)

## 2010-11-18 LAB — CULTURE, BLOOD (ROUTINE X 2)

## 2010-11-19 DIAGNOSIS — M329 Systemic lupus erythematosus, unspecified: Secondary | ICD-10-CM

## 2010-11-19 DIAGNOSIS — G6281 Critical illness polyneuropathy: Secondary | ICD-10-CM

## 2010-11-19 DIAGNOSIS — N186 End stage renal disease: Secondary | ICD-10-CM

## 2010-11-19 DIAGNOSIS — R5381 Other malaise: Secondary | ICD-10-CM

## 2010-11-19 LAB — GLUCOSE, CAPILLARY
Glucose-Capillary: 207 mg/dL — ABNORMAL HIGH (ref 70–99)
Glucose-Capillary: 235 mg/dL — ABNORMAL HIGH (ref 70–99)
Glucose-Capillary: 146 mg/dL — ABNORMAL HIGH (ref 70–99)
Glucose-Capillary: 186 mg/dL — ABNORMAL HIGH (ref 70–99)
Glucose-Capillary: 103 mg/dL — ABNORMAL HIGH (ref 70–99)

## 2010-11-19 LAB — RENAL FUNCTION PANEL
Albumin: 2.1 g/dL — ABNORMAL LOW (ref 3.5–5.2)
GFR calc Af Amer: 22 mL/min — ABNORMAL LOW (ref >60)
Phosphorus: 4.7 mg/dL — ABNORMAL HIGH (ref 2.3–4.6)
Potassium: 3.3 mEq/L — ABNORMAL LOW (ref 3.5–5.1)
Sodium: 131 mEq/L — ABNORMAL LOW (ref 135–145)

## 2010-11-19 LAB — CBC
MCHC: 32.8 g/dL (ref 30.0–36.0)
RDW: 16.9 % — ABNORMAL HIGH (ref 11.5–15.5)

## 2010-11-20 ENCOUNTER — Inpatient Hospital Stay (HOSPITAL_COMMUNITY): Payer: BC Managed Care – PPO

## 2010-11-20 LAB — URINALYSIS, ROUTINE W REFLEX MICROSCOPIC
Bilirubin Urine: NEGATIVE
Glucose, UA: NEGATIVE mg/dL
Ketones, ur: NEGATIVE mg/dL
Protein, ur: 100 mg/dL — AB
pH: 5.5 (ref 5.0–8.0)

## 2010-11-20 LAB — GLUCOSE, CAPILLARY
Glucose-Capillary: 80 mg/dL (ref 70–99)
Glucose-Capillary: 189 mg/dL — ABNORMAL HIGH (ref 70–99)

## 2010-11-20 LAB — RENAL FUNCTION PANEL
Albumin: 2 g/dL — ABNORMAL LOW (ref 3.5–5.2)
CO2: 22 mEq/L (ref 19–32)
Chloride: 98 mEq/L (ref 96–112)
GFR calc Af Amer: 23 mL/min — ABNORMAL LOW (ref >60)
GFR calc non Af Amer: 19 mL/min — ABNORMAL LOW (ref >60)
Sodium: 131 mEq/L — ABNORMAL LOW (ref 135–145)

## 2010-11-20 LAB — CBC
Hemoglobin: 7.1 g/dL — ABNORMAL LOW (ref 12.0–15.0)
MCHC: 32 g/dL (ref 30.0–36.0)
Platelets: 80 10*3/uL — ABNORMAL LOW (ref 150–400)
RBC: 2.5 MIL/uL — ABNORMAL LOW (ref 3.87–5.11)

## 2010-11-20 LAB — URINE MICROSCOPIC-ADD ON

## 2010-11-21 LAB — BASIC METABOLIC PANEL
BUN: 83 mg/dL — ABNORMAL HIGH (ref 6–23)
CO2: 23 mEq/L (ref 19–32)
Chloride: 101 mEq/L (ref 96–112)
Creatinine, Ser: 2.56 mg/dL — ABNORMAL HIGH (ref 0.50–1.10)
GFR calc Af Amer: 24 mL/min — ABNORMAL LOW (ref >60)
Glucose, Bld: 120 mg/dL — ABNORMAL HIGH (ref 70–99)
Potassium: 3.9 mEq/L (ref 3.5–5.1)

## 2010-11-21 LAB — PROTIME-INR: INR: 1.16 (ref 0.00–1.49)

## 2010-11-21 LAB — CBC
MCV: 89.4 fL (ref 78.0–100.0)
Platelets: 81 10*3/uL — ABNORMAL LOW (ref 150–400)
RBC: 2.45 MIL/uL — ABNORMAL LOW (ref 3.87–5.11)
RDW: 17.7 % — ABNORMAL HIGH (ref 11.5–15.5)
WBC: 17.1 10*3/uL — ABNORMAL HIGH (ref 4.0–10.5)

## 2010-11-21 LAB — GLUCOSE, CAPILLARY
Glucose-Capillary: 274 mg/dL — ABNORMAL HIGH (ref 70–99)
Glucose-Capillary: 114 mg/dL — ABNORMAL HIGH (ref 70–99)
Glucose-Capillary: 218 mg/dL — ABNORMAL HIGH (ref 70–99)
Glucose-Capillary: 228 mg/dL — ABNORMAL HIGH (ref 70–99)

## 2010-11-21 LAB — HEPATIC FUNCTION PANEL
ALT: 10 U/L (ref 0–35)
AST: 11 U/L (ref 0–37)
Albumin: 2 g/dL — ABNORMAL LOW (ref 3.5–5.2)
Bilirubin, Direct: 0.2 mg/dL (ref 0.0–0.3)
Total Protein: 4.3 g/dL — ABNORMAL LOW (ref 6.0–8.3)

## 2010-11-22 LAB — RENAL FUNCTION PANEL
Albumin: 1.9 g/dL — ABNORMAL LOW (ref 3.5–5.2)
CO2: 24 mEq/L (ref 19–32)
Chloride: 103 mEq/L (ref 96–112)
GFR calc Af Amer: 26 mL/min — ABNORMAL LOW (ref >60)
GFR calc non Af Amer: 21 mL/min — ABNORMAL LOW (ref >60)
Potassium: 3.8 mEq/L (ref 3.5–5.1)

## 2010-11-22 LAB — CBC
HCT: 21.7 % — ABNORMAL LOW (ref 36.0–46.0)
Platelets: 75 10*3/uL — ABNORMAL LOW (ref 150–400)
RBC: 2.39 MIL/uL — ABNORMAL LOW (ref 3.87–5.11)
RDW: 18 % — ABNORMAL HIGH (ref 11.5–15.5)
WBC: 15.9 10*3/uL — ABNORMAL HIGH (ref 4.0–10.5)

## 2010-11-22 LAB — GLUCOSE, CAPILLARY
Glucose-Capillary: 81 mg/dL (ref 70–99)
Glucose-Capillary: 220 mg/dL — ABNORMAL HIGH (ref 70–99)

## 2010-11-23 ENCOUNTER — Telehealth: Payer: Self-pay | Admitting: Internal Medicine

## 2010-11-23 LAB — CBC
HCT: 25.7 % — ABNORMAL LOW (ref 36.0–46.0)
Hemoglobin: 8.3 g/dL — ABNORMAL LOW (ref 12.0–15.0)
RDW: 18 % — ABNORMAL HIGH (ref 11.5–15.5)
WBC: 16 10*3/uL — ABNORMAL HIGH (ref 4.0–10.5)

## 2010-11-23 LAB — GLUCOSE, CAPILLARY
Glucose-Capillary: 196 mg/dL — ABNORMAL HIGH (ref 70–99)
Glucose-Capillary: 75 mg/dL (ref 70–99)
Glucose-Capillary: 146 mg/dL — ABNORMAL HIGH (ref 70–99)

## 2010-11-24 LAB — COMPREHENSIVE METABOLIC PANEL
ALT: 13 U/L (ref 0–35)
BUN: 67 mg/dL — ABNORMAL HIGH (ref 6–23)
CO2: 21 mEq/L (ref 19–32)
Calcium: 8.7 mg/dL (ref 8.4–10.5)
Creatinine, Ser: 2.03 mg/dL — ABNORMAL HIGH (ref 0.50–1.10)
GFR calc Af Amer: 32 mL/min — ABNORMAL LOW (ref >60)
GFR calc non Af Amer: 26 mL/min — ABNORMAL LOW (ref >60)
Glucose, Bld: 85 mg/dL (ref 70–99)
Sodium: 138 mEq/L (ref 135–145)
Total Protein: 4.2 g/dL — ABNORMAL LOW (ref 6.0–8.3)

## 2010-11-24 LAB — CBC
Hemoglobin: 8.1 g/dL — ABNORMAL LOW (ref 12.0–15.0)
MCH: 29 pg (ref 26.0–34.0)
MCHC: 31.5 g/dL (ref 30.0–36.0)
MCV: 92.1 fL (ref 78.0–100.0)

## 2010-11-24 LAB — GLUCOSE, CAPILLARY
Glucose-Capillary: 84 mg/dL (ref 70–99)
Glucose-Capillary: 135 mg/dL — ABNORMAL HIGH (ref 70–99)
Glucose-Capillary: 147 mg/dL — ABNORMAL HIGH (ref 70–99)

## 2010-11-25 ENCOUNTER — Inpatient Hospital Stay (HOSPITAL_COMMUNITY): Payer: BC Managed Care – PPO

## 2010-11-25 ENCOUNTER — Inpatient Hospital Stay
Admission: AD | Admit: 2010-11-25 | Discharge: 2011-01-06 | Disposition: E | Payer: Self-pay | Source: Other Acute Inpatient Hospital | Attending: Internal Medicine | Admitting: Internal Medicine

## 2010-11-25 DIAGNOSIS — F064 Anxiety disorder due to known physiological condition: Secondary | ICD-10-CM

## 2010-11-25 LAB — GLUCOSE, CAPILLARY: Glucose-Capillary: 142 mg/dL — ABNORMAL HIGH (ref 70–99)

## 2010-11-25 LAB — TYPE AND SCREEN
ABO/RH(D): O POS
Antibody Screen: NEGATIVE
Unit division: 0

## 2010-11-26 ENCOUNTER — Other Ambulatory Visit (HOSPITAL_COMMUNITY): Payer: BC Managed Care – PPO | Attending: Internal Medicine

## 2010-11-26 DIAGNOSIS — I4891 Unspecified atrial fibrillation: Secondary | ICD-10-CM

## 2010-11-26 LAB — DIFFERENTIAL
Basophils Absolute: 0.1 10*3/uL (ref 0.0–0.1)
Basophils Relative: 1 % (ref 0–1)
Eosinophils Absolute: 0.1 10*3/uL (ref 0.0–0.7)
Lymphocytes Relative: 7 % — ABNORMAL LOW (ref 12–46)
Monocytes Absolute: 1.7 10*3/uL — ABNORMAL HIGH (ref 0.1–1.0)
Neutrophils Relative %: 78 % — ABNORMAL HIGH (ref 43–77)
Smear Review: ADEQUATE

## 2010-11-26 LAB — BASIC METABOLIC PANEL
CO2: 16 mEq/L — ABNORMAL LOW (ref 19–32)
Chloride: 107 mEq/L (ref 96–112)
Creatinine, Ser: 2.16 mg/dL — ABNORMAL HIGH (ref 0.50–1.10)
GFR calc Af Amer: 30 mL/min — ABNORMAL LOW (ref >60)
Potassium: 4.7 mEq/L (ref 3.5–5.1)
Sodium: 137 mEq/L (ref 135–145)

## 2010-11-26 LAB — CBC
Hemoglobin: 9.2 g/dL — ABNORMAL LOW (ref 12.0–15.0)
MCH: 28.8 pg (ref 26.0–34.0)
MCHC: 30.4 g/dL (ref 30.0–36.0)
MCV: 95.3 fL (ref 78.0–100.0)
Platelets: 128 10*3/uL — ABNORMAL LOW (ref 150–400)
RBC: 2.98 MIL/uL — ABNORMAL LOW (ref 3.87–5.11)
RDW: 21.4 % — ABNORMAL HIGH (ref 11.5–15.5)
RDW: 21.2 % — ABNORMAL HIGH (ref 11.5–15.5)
WBC: 11 10*3/uL — ABNORMAL HIGH (ref 4.0–10.5)

## 2010-11-26 LAB — RENAL FUNCTION PANEL
Albumin: 2.1 g/dL — ABNORMAL LOW (ref 3.5–5.2)
BUN: 63 mg/dL — ABNORMAL HIGH (ref 6–23)
Calcium: 9 mg/dL (ref 8.4–10.5)
Glucose, Bld: 84 mg/dL (ref 70–99)
Phosphorus: 3.1 mg/dL (ref 2.3–4.6)
Potassium: 5.4 mEq/L — ABNORMAL HIGH (ref 3.5–5.1)
Sodium: 136 mEq/L (ref 135–145)

## 2010-11-26 LAB — PROTIME-INR
INR: 1.18 (ref 0.00–1.49)
Prothrombin Time: 15.3 seconds — ABNORMAL HIGH (ref 11.6–15.2)

## 2010-11-26 NOTE — Discharge Summary (Signed)
  [name], [name] NO.:  [id number]  MEDICAL RECORD NO.:  [id number]  LOCATION:  6742                         FACILITY:  [facility]  PHYSICIAN:  Marinda Elk, M.D.DATE OF BIRTH:  03/22/1961  DATE OF ADMISSION:  10/25/2010 DATE OF DISCHARGE:  December 01, 2010                              DISCHARGE SUMMARY   PRIMARY CARE DOCTOR:  [place] over at Urgent Care.  DISCHARGE DIAGNOSES: 1. Confusion. 2. Atrial fibrillation with rapid ventricular response. 3. Healthcare-associated pneumonia. 4. Clostridium difficile, to complete treatment on November 26, 2010. 5. Right lower extremity hematoma status post multiple procedures. 6. Deep vein thromboses status post inferior vena cava filter. 7. Systemic lupus erythematosus, currently and prednisone. 8. History of renal transplant. 9. History of acute respiratory failure. 10.Anemia of chronic disease. 11.Hypothyroidism. 12.Diabetes mellitus. 13.Probable panic attacks.DISCHARGE MEDICATIONS: 1. Insulin NovoLog 1-9 units t.i.d. 2. Lantus 17 units bedtime. 3. Synthroid 50 mcg daily. 4. Flagyl 500 mg p.o. q.8 h. 5. Mycophenolate 720 mg b.i.d. 6. Oxycodone 5/325 one to two tablets q.4 h. p.r.n. 7. Prednisone 10 mg daily. 8. Florastor 250 mg b.i.d. 9. Vancomycin 125 mg 4 times a day. 10.Alprazolam 0.25 mg p.o. q.6 h. p.r.n. for anxiety. 11.Diltiazem 360 mg at bedtime. 12.Calcium acetate 667 tablets t.i.d. 13.__________ 14.Darbepoetin 100 mg injection subcutaneous on Monday. 15.Benadryl 25 mg at bedtime. 16.Xopenex 0.25 mg inhale q.4 h. 17.Metoprolol 100 mg b.i.d. 18.Zofran 4 mg q.6 h. p.r.n. 19.Protonix 40 mg daily. 20.Sevelamer 800 mg 1 tablet t.i.d.  This is an addendum to job #[id number] dictated on November 23, 2010.  BRIEF HOSPITAL COURSE:  The patient was kept here due to insurance purposes, trying to get approved for LT[facility] ([facility]).  Today on 12/01/10, she was approved.  I discussed with the patient extensively.  It  seems that she has been having panic attack.  She wants to avoid anxiety medications, panic attack medications.  She requested a psychiatrist consult which we have called and she will be seen and select by the psychiatrist.  On day of discharge; temperature 97, pulse 96, respirations 21, blood pressure 120/82.  She was satting 99% on room air.  Labs on day of discharge showed none.     Marinda Elk, M.D.     AF/MEDQ  D:  12-01-2010  T:  11/26/2010  Job:  [id number]  cc:   [facility]  Electronically Signed by Marinda Elk M.D. on 11/26/2010 06:23:32 PM

## 2010-11-27 ENCOUNTER — Other Ambulatory Visit (HOSPITAL_COMMUNITY): Payer: BC Managed Care – PPO

## 2010-11-27 DIAGNOSIS — F064 Anxiety disorder due to known physiological condition: Secondary | ICD-10-CM

## 2010-11-27 LAB — T3: T3, Total: 44.3 ng/dl — ABNORMAL LOW (ref 80.0–204.0)

## 2010-11-27 LAB — BASIC METABOLIC PANEL
BUN: 62 mg/dL — ABNORMAL HIGH (ref 6–23)
Chloride: 109 mEq/L (ref 96–112)
GFR calc Af Amer: 33 mL/min — ABNORMAL LOW (ref >60)
Potassium: 3.6 mEq/L (ref 3.5–5.1)
Sodium: 139 mEq/L (ref 135–145)

## 2010-11-27 LAB — T4, FREE: Free T4: 0.71 ng/dL — ABNORMAL LOW (ref 0.80–1.80)

## 2010-11-27 LAB — CBC
HCT: 30.2 % — ABNORMAL LOW (ref 36.0–46.0)
Hemoglobin: 9 g/dL — ABNORMAL LOW (ref 12.0–15.0)
RBC: 3.17 MIL/uL — ABNORMAL LOW (ref 3.87–5.11)
RDW: 21.3 % — ABNORMAL HIGH (ref 11.5–15.5)

## 2010-11-27 LAB — T4: T4, Total: 4 ug/dL — ABNORMAL LOW (ref 5.0–12.5)

## 2010-11-27 LAB — TSH: TSH: 80.877 u[IU]/mL — ABNORMAL HIGH (ref 0.350–4.500)

## 2010-11-29 ENCOUNTER — Other Ambulatory Visit (HOSPITAL_COMMUNITY): Payer: BC Managed Care – PPO

## 2010-11-29 LAB — CROSSMATCH
ABO/RH(D): O POS
Antibody Screen: NEGATIVE
Unit division: 0

## 2010-11-29 LAB — BASIC METABOLIC PANEL
Calcium: 9.2 mg/dL (ref 8.4–10.5)
GFR calc Af Amer: 26 mL/min — ABNORMAL LOW (ref >60)
GFR calc non Af Amer: 22 mL/min — ABNORMAL LOW (ref >60)
Sodium: 139 mEq/L (ref 135–145)

## 2010-11-29 LAB — CBC
MCH: 28.9 pg (ref 26.0–34.0)
MCHC: 29.8 g/dL — ABNORMAL LOW (ref 30.0–36.0)
Platelets: 135 10*3/uL — ABNORMAL LOW (ref 150–400)
RBC: 3.15 MIL/uL — ABNORMAL LOW (ref 3.87–5.11)
RDW: 22.3 % — ABNORMAL HIGH (ref 11.5–15.5)

## 2010-11-30 LAB — URINALYSIS, ROUTINE W REFLEX MICROSCOPIC
Ketones, ur: NEGATIVE mg/dL
Nitrite: NEGATIVE
Protein, ur: 100 mg/dL — AB

## 2010-11-30 LAB — BASIC METABOLIC PANEL
Calcium: 9.1 mg/dL (ref 8.4–10.5)
GFR calc Af Amer: 25 mL/min — ABNORMAL LOW (ref >60)
GFR calc non Af Amer: 21 mL/min — ABNORMAL LOW (ref >60)
Potassium: 4.4 mEq/L (ref 3.5–5.1)
Sodium: 140 mEq/L (ref 135–145)

## 2010-11-30 LAB — URINE MICROSCOPIC-ADD ON

## 2010-11-30 LAB — PREALBUMIN: Prealbumin: 38.8 mg/dL — ABNORMAL HIGH (ref 17.0–34.0)

## 2010-11-30 NOTE — Discharge Summary (Signed)
[name], RECH NO.:  [id number]  MEDICAL RECORD NO.:  [id number]  LOCATION:  6742                         FACILITY:  [facility]  PHYSICIAN:  Pleas Koch, MD        DATE OF BIRTH:  Aug 30, 1960  DATE OF ADMISSION:  10/25/2010 DATE OF DISCHARGE:                              DISCHARGE SUMMARY   DATE OF HOPEFUL DISCHARGE:  November 23, 2010  DISCHARGE DIAGNOSES: 1. Brief period of unresponsiveness, lasting 3 minutes, which was the     reason she came in. 2. Atrial fibrillation with rapid ventricular response. 3. Healthcare-associated pneumonia and urinary tract infection treated     with antibiotics until November 19, 2010 and November 18, 2010     respectively. 4. Clostridium difficile to complete treatment of vanc and Flagyl November 25, 2010. 5. Right lower extremity hematoma, status post multiple procedures by     Dr. Noelle Penner of Plastic Surgery. 6. Deep vein thrombosis, status post inferior vena cava filter and     remote history of retroperitoneal bleed. 7. Systemic lupus erythematosus with prednisone taper. 8. Status post renal transplant.  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 9. Acute respiratory failure, prompting brief visit to the ICU with     returned back. 10.Anemia, status post transfusion in hospital. 11.Diabetes mellitus, on insulin. 12.Hypothyroidism.  MEDICATIONS:  Medications up till this point have included the following: 1. Diltiazem 360 daily. 2. Ensure 237 q.i.d. 3. Lasix 40 q.12 h. 4. Lantus 1-9 units p.r.n. 5. Insulin 1-9 units t.i.d. with meals. 6. Lantus 7 units q.p.m. 7. Levalbuterol 0.63 q.6 h. 8. Levothyroxine 50 mg daily a.c. 9. Metoprolol 100 b.i.d. 10.Flagyl q.8 h. 11.Mycophenolate sodium 720 b.i.d. 12.Pantoprazole 40 q.12 h. 13.Prednisone 10 mg daily with taper to 5 mg. 14.Florastor 250 b.i.d. 15.Scopolamine 1 patch q.72 h. 16.Vancomycin 125 q.i.d. 17.Oxycodone/Percocet 2 tablets q.4 h.  p.r.n.  IMAGING STUDIES:  Since last dictation included chest x-ray on November 20, 2010 showing dense right lower lobe atelectasis.  HOSPITAL COURSE:  The patient was seen from the 13th onwards and had no significant acute findings.  She has been undergoing followup with both Plastic Surgery and with Renal.  Hospital Course According To Issues: 1. Confusion.  The patient experienced a brief episode of confusion,     which prompted this admission.  This may be secondary to the     prednisone that she has been on, hence the steroid taper was     induced, and she is currently tapering to 10 mg, may be able to go     home on 5 mg. 2. AFib with RVR.  The patient is on diltiazem and metoprolol and     became rate uncontrolled on 15th and 16th of this month.  She is     not on Coumadin secondary to multiple procedures and     retroperitoneal bleed and is in fact status post IVC filter.  She     is under much better rate control now after being seen by Dr.     Gala Romney and is doing well from a Cardiovascular standpoint. 3. HCAP.  This was treated, and the patient has completed course of     antibiotics including linezolid and cefepime on 14th. 4. Pseudomonas UTI.  This was treated on 13th and completed course of     cefepime. 5. C diff.  The patient has experienced less and less diarrhea and     currently on vancomycin and Flagyl until December 18, 2010. 6. Right lower extremity hematoma.  The patient was visited by Dr.     Noelle Penner on June 16 who recommended followup with various dressings     and once the patient is discharged from the hospital hence [facility] can go and see him for further care. 7. SLE.  This was stable during hospitalization. 8. Status post renal transplant, CKD III-IV in remained kidney.  The     patient will continue Myfortic as schedule but lower-dose     prednisone. 9. The patient had acute respiratory failure earlier in     hospitalization, which  resolved. 10.Anemia.  The patient's blood count drifted to 6.7.  Today, it is     8.3. 11.Leukocytosis, multifactorial, likely secondary to her multiple     infections.  She is also on steroids which may be a cause for this. 12.Thrombocytopenia, possibly related to her chronic SLE. 13.Diabetes mellitus.  She is on Lantus at 8 units and will continue     the same. 14.Hypothyroidism.  She will continue on lower dose of thyroxine as it     was noted that TSH during this hospitalization was 35.  The patient was seen on the day of discharge.  She was doing well.  Same day of review, was doing well.  Temperature is 98.3, pulse 100, respirations 18, blood pressure 101-117/70-87, and satting 97% to 98% on room air.  CBG were well-controlled 75 onwards and weight was 75. Hemoglobin 8.3, hematocrit 25.7, white count 16, platelet count 89.  She will have a CMP tomorrow and if she is stable to go, I will reconcile her medications and with a quick addendum.          ______________________________ Pleas Koch, MD     JS/MEDQ  D:  11/23/2010  T:  11/23/2010  Job:  [id number]  Electronically Signed by Pleas Koch MD on 11/30/2010 07:44:12 PM

## 2010-12-01 ENCOUNTER — Other Ambulatory Visit (HOSPITAL_COMMUNITY): Payer: BC Managed Care – PPO | Attending: Internal Medicine

## 2010-12-01 ENCOUNTER — Other Ambulatory Visit (HOSPITAL_COMMUNITY): Payer: BC Managed Care – PPO

## 2010-12-01 DIAGNOSIS — J690 Pneumonitis due to inhalation of food and vomit: Secondary | ICD-10-CM

## 2010-12-01 DIAGNOSIS — M329 Systemic lupus erythematosus, unspecified: Secondary | ICD-10-CM

## 2010-12-01 DIAGNOSIS — J9600 Acute respiratory failure, unspecified whether with hypoxia or hypercapnia: Secondary | ICD-10-CM

## 2010-12-01 LAB — CBC
MCH: 29.3 pg (ref 26.0–34.0)
Platelets: 135 10*3/uL — ABNORMAL LOW (ref 150–400)
RBC: 2.87 MIL/uL — ABNORMAL LOW (ref 3.87–5.11)
WBC: 14.9 10*3/uL — ABNORMAL HIGH (ref 4.0–10.5)

## 2010-12-01 LAB — DIGOXIN LEVEL: Digoxin Level: 1.8 ng/mL (ref 0.8–2.0)

## 2010-12-01 LAB — RENAL FUNCTION PANEL
Albumin: 2.1 g/dL — ABNORMAL LOW (ref 3.5–5.2)
BUN: 77 mg/dL — ABNORMAL HIGH (ref 6–23)
CO2: 21 mEq/L (ref 19–32)
Chloride: 104 mEq/L (ref 96–112)
Creatinine, Ser: 2.72 mg/dL — ABNORMAL HIGH (ref 0.50–1.10)
GFR calc non Af Amer: 19 mL/min — ABNORMAL LOW (ref >60)
Potassium: 5.1 mEq/L (ref 3.5–5.1)

## 2010-12-01 LAB — BLOOD GAS, ARTERIAL
Bicarbonate: 17.9 mEq/L — ABNORMAL LOW (ref 20.0–24.0)
FIO2: 1 %
O2 Saturation: 100 %
PEEP: 5 cmH2O
pO2, Arterial: 307 mmHg — ABNORMAL HIGH (ref 80.0–100.0)

## 2010-12-02 ENCOUNTER — Other Ambulatory Visit (HOSPITAL_COMMUNITY): Payer: BC Managed Care – PPO

## 2010-12-02 DIAGNOSIS — M329 Systemic lupus erythematosus, unspecified: Secondary | ICD-10-CM

## 2010-12-02 DIAGNOSIS — J9600 Acute respiratory failure, unspecified whether with hypoxia or hypercapnia: Secondary | ICD-10-CM

## 2010-12-02 DIAGNOSIS — J690 Pneumonitis due to inhalation of food and vomit: Secondary | ICD-10-CM

## 2010-12-02 LAB — CBC
MCH: 29.6 pg (ref 26.0–34.0)
MCHC: 30.2 g/dL (ref 30.0–36.0)
MCV: 98 fL (ref 78.0–100.0)
RDW: 21.8 % — ABNORMAL HIGH (ref 11.5–15.5)

## 2010-12-02 LAB — COMPREHENSIVE METABOLIC PANEL
AST: 12 U/L (ref 0–37)
Albumin: 1.6 g/dL — ABNORMAL LOW (ref 3.5–5.2)
Calcium: 8.7 mg/dL (ref 8.4–10.5)
Creatinine, Ser: 2.84 mg/dL — ABNORMAL HIGH (ref 0.50–1.10)

## 2010-12-03 ENCOUNTER — Other Ambulatory Visit (HOSPITAL_COMMUNITY): Payer: BC Managed Care – PPO

## 2010-12-03 ENCOUNTER — Other Ambulatory Visit: Payer: Self-pay | Admitting: Internal Medicine

## 2010-12-03 ENCOUNTER — Other Ambulatory Visit (HOSPITAL_COMMUNITY): Payer: BC Managed Care – PPO | Attending: Internal Medicine

## 2010-12-03 LAB — BASIC METABOLIC PANEL
CO2: 18 mEq/L — ABNORMAL LOW (ref 19–32)
Chloride: 101 mEq/L (ref 96–112)
Creatinine, Ser: 2.62 mg/dL — ABNORMAL HIGH (ref 0.50–1.10)
GFR calc Af Amer: 24 mL/min — ABNORMAL LOW (ref >60)
Potassium: 4.5 mEq/L (ref 3.5–5.1)

## 2010-12-03 LAB — CBC
MCV: 93.5 fL (ref 78.0–100.0)
Platelets: 117 10*3/uL — ABNORMAL LOW (ref 150–400)
RBC: 3.53 MIL/uL — ABNORMAL LOW (ref 3.87–5.11)
WBC: 8.9 10*3/uL (ref 4.0–10.5)

## 2010-12-03 LAB — MAGNESIUM: Magnesium: 1.8 mg/dL (ref 1.5–2.5)

## 2010-12-04 ENCOUNTER — Other Ambulatory Visit (HOSPITAL_COMMUNITY): Payer: BC Managed Care – PPO

## 2010-12-04 LAB — CULTURE, RESPIRATORY W GRAM STAIN

## 2010-12-04 LAB — CROSSMATCH
Antibody Screen: NEGATIVE
Unit division: 0

## 2010-12-04 LAB — BASIC METABOLIC PANEL
BUN: 87 mg/dL — ABNORMAL HIGH (ref 6–23)
CO2: 17 mEq/L — ABNORMAL LOW (ref 19–32)
Chloride: 100 mEq/L (ref 96–112)
Glucose, Bld: 209 mg/dL — ABNORMAL HIGH (ref 70–99)
Potassium: 3.9 mEq/L (ref 3.5–5.1)
Sodium: 131 mEq/L — ABNORMAL LOW (ref 135–145)

## 2010-12-04 LAB — CBC
HCT: 33.9 % — ABNORMAL LOW (ref 36.0–46.0)
Hemoglobin: 10.8 g/dL — ABNORMAL LOW (ref 12.0–15.0)
RBC: 3.61 MIL/uL — ABNORMAL LOW (ref 3.87–5.11)
WBC: 10.8 10*3/uL — ABNORMAL HIGH (ref 4.0–10.5)

## 2010-12-05 LAB — BASIC METABOLIC PANEL
Calcium: 9.5 mg/dL (ref 8.4–10.5)
Chloride: 98 mEq/L (ref 96–112)
Creatinine, Ser: 2.57 mg/dL — ABNORMAL HIGH (ref 0.50–1.10)
GFR calc Af Amer: 24 mL/min — ABNORMAL LOW (ref >60)
GFR calc non Af Amer: 20 mL/min — ABNORMAL LOW (ref >60)

## 2010-12-05 LAB — CBC
MCV: 92.1 fL (ref 78.0–100.0)
Platelets: 116 10*3/uL — ABNORMAL LOW (ref 150–400)
RDW: 19.1 % — ABNORMAL HIGH (ref 11.5–15.5)
WBC: 14.4 10*3/uL — ABNORMAL HIGH (ref 4.0–10.5)

## 2010-12-06 LAB — CBC
HCT: 39.8 % (ref 36.0–46.0)
Platelets: 124 10*3/uL — ABNORMAL LOW (ref 150–400)
RDW: 19.1 % — ABNORMAL HIGH (ref 11.5–15.5)
WBC: 17.6 10*3/uL — ABNORMAL HIGH (ref 4.0–10.5)

## 2010-12-06 LAB — URINE CULTURE
Colony Count: >=100000
Special Requests: NEGATIVE

## 2010-12-06 LAB — BASIC METABOLIC PANEL
Chloride: 97 mEq/L (ref 96–112)
GFR calc Af Amer: 24 mL/min — ABNORMAL LOW (ref >60)
Potassium: 3.1 mEq/L — ABNORMAL LOW (ref 3.5–5.1)
Sodium: 130 mEq/L — ABNORMAL LOW (ref 135–145)

## 2010-12-06 NOTE — Consult Note (Signed)
NAME:  [name], [name] NO.:  [id number]  MEDICAL RECORD NO.:  [id number]           PATIENT TYPE:  I  LOCATION:  2603                         FACILITY:  [facility]  PHYSICIAN:  Mont Dutton, MD    DATE OF BIRTH:  1961-03-04  DATE OF CONSULTATION:  10/25/2010 DATE OF DISCHARGE:                                CONSULTATION   REQUESTING PHYSICIAN:  Kathlen Mody, MD  HISTORY OF PRESENT ILLNESS:  [name] is a very ill 50 year old female with multiple medical problems including SLE, renal transplant, atrial fibrillation, multiple episodes of pneumonia, recent C. diff infection, recent Pseudomonas UTI, and recent right extremity DVT who was transferred from a [facility] to the ER today for an episode of decreased responsiveness.  Per the H and P, she had a 3- minute episode of blinking of the eyes and bowel incontinence. Apparently, this occurred after she received Ativan for bath.  Her husband states that she was never completely unresponsive, but she was less responsive and more confused.  She has had fluctuating alteration in her mental status over the past several days.  Yesterday, she was extremely confused and this has been getting worse over the past 3 days he says.  She did not have a history of seizure disorder.  Her husband states that she had never gotten Ativan for bath before and he is not sure why that was given.  She does receive narcotics intermittently and he states that this is typically mixture of sleepy and more confused.  REVIEW OF SYSTEMS:  See HPI, otherwise negative.  PAST MEDICAL HISTORY: 1. Stage IV chronic kidney disease with prior renal transplant, on     chronic immunosuppression. 2. History of atrial fibrillation, now on Coumadin secondary to     hematoma of the left leg requiring multiple surgical procedures. 3. 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 4. History of recent C. diff infection. 5.  History of recent pneumonia. 6. Systemic lupus erythematous. 7. 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 8. Recent Pseudomonas urinary tract infection. 9. Right lower extremity hematoma from anticoagulation. 10.Right lower extremity DVT. 11.Compensated diastolic heart failure with preserved systolic     function. 12.History of toxic metabolic encephalopathy from infections. 13.Anemia. 14.Mild thrombocytopenia. 15.Acute retroperitoneal bleed secondary to anticoagulation. 16.Hypertension. 17.Hypoalbuminemia.  CURRENT MEDICATIONS:  PhosLo, darbepoetin alfa, diltiazem, Lovenox, fentanyl, fluconazole, metoprolol, mycophenolate, pantoprazole, prednisone, Renagel, acetaminophen, Xanax p.r.n., Benadryl p.r.n., Vicodin p.r.n., Dilaudid p.r.n., Xopenex p.r.n., Ativan p.r.n., and Zofran p.r.n.  ALLERGIES:  COMPAZINE, SULFA, and AMBIEN.  FAMILY HISTORY:  Noncontributory.  SOCIAL HISTORY:  She is married.  No smoking, alcohol or illicits.  She has been in and out of the hospital recently.  PHYSICAL EXAMINATION:  VITAL SIGNS:  Temperature 97.9, pulse 108, respirations 22, blood pressure 136/98. GENERAL:  Chronically ill appearing, somnolent. CARDIOVASCULAR:  Irregularly irregular. MENTAL STATUS.  She opens her eyes to verbal stimuli, but she does not answer any questions.  She does not tell me her name.  She does follow simple commands. CRANIAL NERVES:  Pupils equal, round and reactive to light bilaterally. Extraocular movements intact, facial  expression symmetric, tongue midline. MOTOR:  She is diffusely weak.  She elevates both arms against gravity, but only briefly.  She is not able to participate in strength testing. She wiggles her toes on both sides.  Her tone is normal. DEEP TENDON REFLEXES:  2+/4 bilateral upper extremities, absent lower extremities. COORDINATION:  Unable to test. SENSATION:  Unable to test. GAIT:  Not tested.  IMAGING:  Noncontrast head CT  reveals diffuse atrophy.  No acute intracranial process.  LABORATORY DATA: 1. CMP significant for BUN of 96 and a creatinine of 3.61. 2. AST and ALT are within normal limits. 3. CBC is significant for leukocytosis at 16.2 and anemia with     hematocrit of 29.  ASSESSMENT AND PLAN:  Ms. [name] is a 50 year old female with multiple medical problems including worsening renal failure, anemia, increasing leukocytosis, recent urinary tract infection, recent pneumonia, recent Clostridium difficile colitis who had an episode of decreased responsiveness, possibly blinking, possible bowel incontinence after receiving Ativan in preparation for bath.  She has not received Ativan before per her husband for bath.  I suspect that the alteration in mental status is likely a combination of her chronic medical problems as well as Ativan administration.  Low suspicion for a seizure.  She continues to have a worsening mental status today compared to yesterday. This may be related to delayed effects of the Ativan and also possibly no infection since she has an increasing leukocytosis.  RECOMMENDATIONS: 1. Obtain MRI of the brain without contrast. 2. Obtain routine EEG. 3. Avoid administration of benzodiazepines. 4. Limit narcotic use. 5. Antiepileptic medication is not indicated at this time since we do     not have definitive diagnosis of seizures at this time.  Thank you for this consultation.  We will continue to follow.          ______________________________ Mont Dutton, MD     CS/MEDQ  D:  10/25/2010  T:  10/26/2010  Job:  [id number]  Electronically Signed by Mont Dutton MD on 12/06/2010 06:59:42 AM

## 2010-12-06 DEATH — deceased

## 2010-12-07 ENCOUNTER — Other Ambulatory Visit (HOSPITAL_COMMUNITY): Payer: BC Managed Care – PPO

## 2010-12-07 DIAGNOSIS — J9620 Acute and chronic respiratory failure, unspecified whether with hypoxia or hypercapnia: Secondary | ICD-10-CM

## 2010-12-07 DIAGNOSIS — M329 Systemic lupus erythematosus, unspecified: Secondary | ICD-10-CM

## 2010-12-07 DIAGNOSIS — J690 Pneumonitis due to inhalation of food and vomit: Secondary | ICD-10-CM

## 2010-12-07 LAB — URINALYSIS, ROUTINE W REFLEX MICROSCOPIC
Glucose, UA: NEGATIVE mg/dL
Ketones, ur: NEGATIVE mg/dL
Nitrite: NEGATIVE
Protein, ur: >300 mg/dL — AB
Specific Gravity, Urine: 1.023 (ref 1.005–1.030)
Urobilinogen, UA: 0.2 mg/dL (ref 0.0–1.0)
pH: 5 (ref 5.0–8.0)

## 2010-12-07 LAB — URINE MICROSCOPIC-ADD ON

## 2010-12-07 LAB — BASIC METABOLIC PANEL
BUN: 122 mg/dL — ABNORMAL HIGH (ref 6–23)
Chloride: 96 mEq/L (ref 96–112)
GFR calc Af Amer: 25 mL/min — ABNORMAL LOW (ref >60)
Glucose, Bld: 256 mg/dL — ABNORMAL HIGH (ref 70–99)
Potassium: 3.6 mEq/L (ref 3.5–5.1)
Sodium: 129 mEq/L — ABNORMAL LOW (ref 135–145)

## 2010-12-07 LAB — CULTURE, BAL-QUANTITATIVE W GRAM STAIN: Colony Count: 15000

## 2010-12-07 LAB — OSMOLALITY: Osmolality: 326 mOsm/kg — ABNORMAL HIGH (ref 275–300)

## 2010-12-07 LAB — DIGOXIN LEVEL: Digoxin Level: 0.7 ng/mL — ABNORMAL LOW (ref 0.8–2.0)

## 2010-12-07 LAB — OSMOLALITY, URINE: Osmolality, Ur: 352 mOsm/kg — ABNORMAL LOW (ref 390–1090)

## 2010-12-07 LAB — CBC
HCT: 40.5 % (ref 36.0–46.0)
Hemoglobin: 13.2 g/dL (ref 12.0–15.0)
RBC: 4.41 MIL/uL (ref 3.87–5.11)
WBC: 21.5 10*3/uL — ABNORMAL HIGH (ref 4.0–10.5)

## 2010-12-07 LAB — MAGNESIUM: Magnesium: 1.9 mg/dL (ref 1.5–2.5)

## 2010-12-07 LAB — URIC ACID: Uric Acid, Serum: 9.7 mg/dL — ABNORMAL HIGH (ref 2.4–7.0)

## 2010-12-07 LAB — PREALBUMIN: Prealbumin: 47.3 mg/dL — ABNORMAL HIGH (ref 17.0–34.0)

## 2010-12-08 ENCOUNTER — Other Ambulatory Visit (HOSPITAL_COMMUNITY): Payer: BC Managed Care – PPO

## 2010-12-08 ENCOUNTER — Other Ambulatory Visit (HOSPITAL_COMMUNITY): Payer: BC Managed Care – PPO | Attending: Internal Medicine

## 2010-12-08 DIAGNOSIS — J189 Pneumonia, unspecified organism: Secondary | ICD-10-CM

## 2010-12-08 DIAGNOSIS — A499 Bacterial infection, unspecified: Secondary | ICD-10-CM

## 2010-12-08 DIAGNOSIS — K5289 Other specified noninfective gastroenteritis and colitis: Secondary | ICD-10-CM

## 2010-12-08 DIAGNOSIS — G9340 Encephalopathy, unspecified: Secondary | ICD-10-CM

## 2010-12-08 DIAGNOSIS — A419 Sepsis, unspecified organism: Secondary | ICD-10-CM

## 2010-12-08 DIAGNOSIS — R509 Fever, unspecified: Secondary | ICD-10-CM

## 2010-12-08 LAB — BLOOD GAS, VENOUS
Acid-base deficit: 11.3 mmol/L — ABNORMAL HIGH (ref 0.0–2.0)
Bicarbonate: 18 mEq/L — ABNORMAL LOW (ref 20.0–24.0)
FIO2: 0.5 %
MECHVT: 490 mL
MECHVT: 490 mL
O2 Saturation: 70.7 %
O2 Saturation: 60 %
PEEP: 5 cmH2O
Patient temperature: 98.6
Patient temperature: 98.6
RATE: 24 resp/min
TCO2: 15.9 mmol/L (ref 0–100)
pCO2, Ven: 36.6 mmHg — ABNORMAL LOW (ref 45.0–50.0)

## 2010-12-08 LAB — BLOOD GAS, ARTERIAL
Acid-base deficit: 11.4 mmol/L — ABNORMAL HIGH (ref 0.0–2.0)
Bicarbonate: 15.8 mEq/L — ABNORMAL LOW (ref 20.0–24.0)
FIO2: 1 %
MECHVT: 490 mL
O2 Saturation: 78.6 %
TCO2: 17.3 mmol/L (ref 0–100)
pCO2 arterial: 47.3 mmHg — ABNORMAL HIGH (ref 35.0–45.0)
pO2, Arterial: 49.1 mmHg — ABNORMAL LOW (ref 80.0–100.0)

## 2010-12-08 LAB — HEPATIC FUNCTION PANEL
Alkaline Phosphatase: 310 U/L — ABNORMAL HIGH (ref 39–117)
Bilirubin, Direct: 0.2 mg/dL (ref 0.0–0.3)
Indirect Bilirubin: 0.2 mg/dL — ABNORMAL LOW (ref 0.3–0.9)
Total Bilirubin: 0.4 mg/dL (ref 0.3–1.2)

## 2010-12-08 LAB — BASIC METABOLIC PANEL
Calcium: 8.9 mg/dL (ref 8.4–10.5)
GFR calc Af Amer: 25 mL/min — ABNORMAL LOW (ref >60)
GFR calc non Af Amer: 20 mL/min — ABNORMAL LOW (ref >60)
Glucose, Bld: 200 mg/dL — ABNORMAL HIGH (ref 70–99)
Potassium: 3.1 mEq/L — ABNORMAL LOW (ref 3.5–5.1)
Sodium: 131 mEq/L — ABNORMAL LOW (ref 135–145)

## 2010-12-08 LAB — TSH: TSH: 25.853 u[IU]/mL — ABNORMAL HIGH (ref 0.350–4.500)

## 2010-12-08 LAB — CBC
HCT: 39.4 % (ref 36.0–46.0)
Hemoglobin: 12.9 g/dL (ref 12.0–15.0)
MCH: 30.5 pg (ref 26.0–34.0)
MCHC: 32.7 g/dL (ref 30.0–36.0)
MCV: 93.1 fL (ref 78.0–100.0)
RDW: 19.9 % — ABNORMAL HIGH (ref 11.5–15.5)

## 2010-12-08 LAB — PROCALCITONIN: Procalcitonin: 0.5 ng/mL

## 2010-12-08 LAB — PHOSPHORUS: Phosphorus: 6 mg/dL — ABNORMAL HIGH (ref 2.3–4.6)

## 2010-12-08 LAB — CORTISOL: Cortisol, Plasma: 30.1 ug/dL

## 2010-12-08 LAB — SODIUM: Sodium: 130 mEq/L — ABNORMAL LOW (ref 135–145)

## 2010-12-09 ENCOUNTER — Other Ambulatory Visit (HOSPITAL_COMMUNITY): Payer: BC Managed Care – PPO

## 2010-12-09 DIAGNOSIS — J189 Pneumonia, unspecified organism: Secondary | ICD-10-CM

## 2010-12-09 DIAGNOSIS — R509 Fever, unspecified: Secondary | ICD-10-CM

## 2010-12-09 DIAGNOSIS — A499 Bacterial infection, unspecified: Secondary | ICD-10-CM

## 2010-12-09 DIAGNOSIS — K5289 Other specified noninfective gastroenteritis and colitis: Secondary | ICD-10-CM

## 2010-12-09 LAB — DIFFERENTIAL
Basophils Relative: 0 % (ref 0–1)
Eosinophils Absolute: 0 10*3/uL (ref 0.0–0.7)
Eosinophils Relative: 0 % (ref 0–5)
Lymphs Abs: 0.4 10*3/uL — ABNORMAL LOW (ref 0.7–4.0)
Monocytes Absolute: 0.9 10*3/uL (ref 0.1–1.0)

## 2010-12-09 LAB — CBC
HCT: 31.7 % — ABNORMAL LOW (ref 36.0–46.0)
Hemoglobin: 10.7 g/dL — ABNORMAL LOW (ref 12.0–15.0)
MCH: 30.4 pg (ref 26.0–34.0)
MCHC: 33.8 g/dL (ref 30.0–36.0)

## 2010-12-09 LAB — BASIC METABOLIC PANEL
BUN: 131 mg/dL — ABNORMAL HIGH (ref 6–23)
Calcium: 8.8 mg/dL (ref 8.4–10.5)
Creatinine, Ser: 2.59 mg/dL — ABNORMAL HIGH (ref 0.50–1.10)
GFR calc non Af Amer: 20 mL/min — ABNORMAL LOW (ref >60)
Glucose, Bld: 176 mg/dL — ABNORMAL HIGH (ref 70–99)

## 2010-12-10 ENCOUNTER — Other Ambulatory Visit (HOSPITAL_COMMUNITY): Payer: BC Managed Care – PPO

## 2010-12-10 LAB — BLOOD GAS, VENOUS
Acid-base deficit: 8.7 mmol/L — ABNORMAL HIGH (ref 0.0–2.0)
RATE: 24 resp/min
pCO2, Ven: 31.7 mmHg — ABNORMAL LOW (ref 45.0–50.0)
pO2, Ven: 34.8 mmHg (ref 30.0–45.0)

## 2010-12-10 LAB — BASIC METABOLIC PANEL
Calcium: 9.1 mg/dL (ref 8.4–10.5)
GFR calc non Af Amer: 20 mL/min — ABNORMAL LOW (ref >60)
Glucose, Bld: 156 mg/dL — ABNORMAL HIGH (ref 70–99)
Sodium: 132 mEq/L — ABNORMAL LOW (ref 135–145)

## 2010-12-10 LAB — CULTURE, RESPIRATORY W GRAM STAIN

## 2010-12-10 LAB — LACTIC ACID, PLASMA: Lactic Acid, Venous: 3.7 mmol/L — ABNORMAL HIGH (ref 0.5–2.2)

## 2010-12-10 LAB — CBC
Hemoglobin: 10.1 g/dL — ABNORMAL LOW (ref 12.0–15.0)
MCH: 29.8 pg (ref 26.0–34.0)
MCHC: 32.4 g/dL (ref 30.0–36.0)
Platelets: DECREASED 10*3/uL (ref 150–400)

## 2010-12-10 LAB — TECHNOLOGIST SMEAR REVIEW

## 2010-12-11 ENCOUNTER — Other Ambulatory Visit (HOSPITAL_COMMUNITY): Payer: BC Managed Care – PPO

## 2010-12-11 DIAGNOSIS — R509 Fever, unspecified: Secondary | ICD-10-CM

## 2010-12-11 DIAGNOSIS — A0472 Enterocolitis due to Clostridium difficile, not specified as recurrent: Secondary | ICD-10-CM

## 2010-12-11 DIAGNOSIS — J189 Pneumonia, unspecified organism: Secondary | ICD-10-CM

## 2010-12-11 LAB — CBC
MCV: 92.6 fL (ref 78.0–100.0)
Platelets: 87 10*3/uL — ABNORMAL LOW (ref 150–400)
RDW: 20.3 % — ABNORMAL HIGH (ref 11.5–15.5)
WBC: 25.5 10*3/uL — ABNORMAL HIGH (ref 4.0–10.5)

## 2010-12-11 LAB — BASIC METABOLIC PANEL
Chloride: 101 mEq/L (ref 96–112)
Creatinine, Ser: 2.5 mg/dL — ABNORMAL HIGH (ref 0.50–1.10)
GFR calc Af Amer: 25 mL/min — ABNORMAL LOW (ref >60)

## 2010-12-11 LAB — MAGNESIUM: Magnesium: 2.2 mg/dL (ref 1.5–2.5)

## 2010-12-14 LAB — CULTURE, BLOOD (ROUTINE X 2)

## 2011-01-06 DEATH — deceased

## 2011-03-08 LAB — POCT I-STAT, CHEM 8
Calcium, Ion: 1.22
Glucose, Bld: 110 — ABNORMAL HIGH
HCT: 34 — ABNORMAL LOW
Hemoglobin: 11.6 — ABNORMAL LOW
Potassium: 3.8
TCO2: 18

## 2013-02-06 IMAGING — CR DG CHEST 1V PORT
1 series · 1 of 1 positions shown · non-contrast
Comparison: 08/14/2010

CLINICAL DATA: Shortness of breath and chest pain

PORTABLE CHEST - 1 VIEW

[AP]
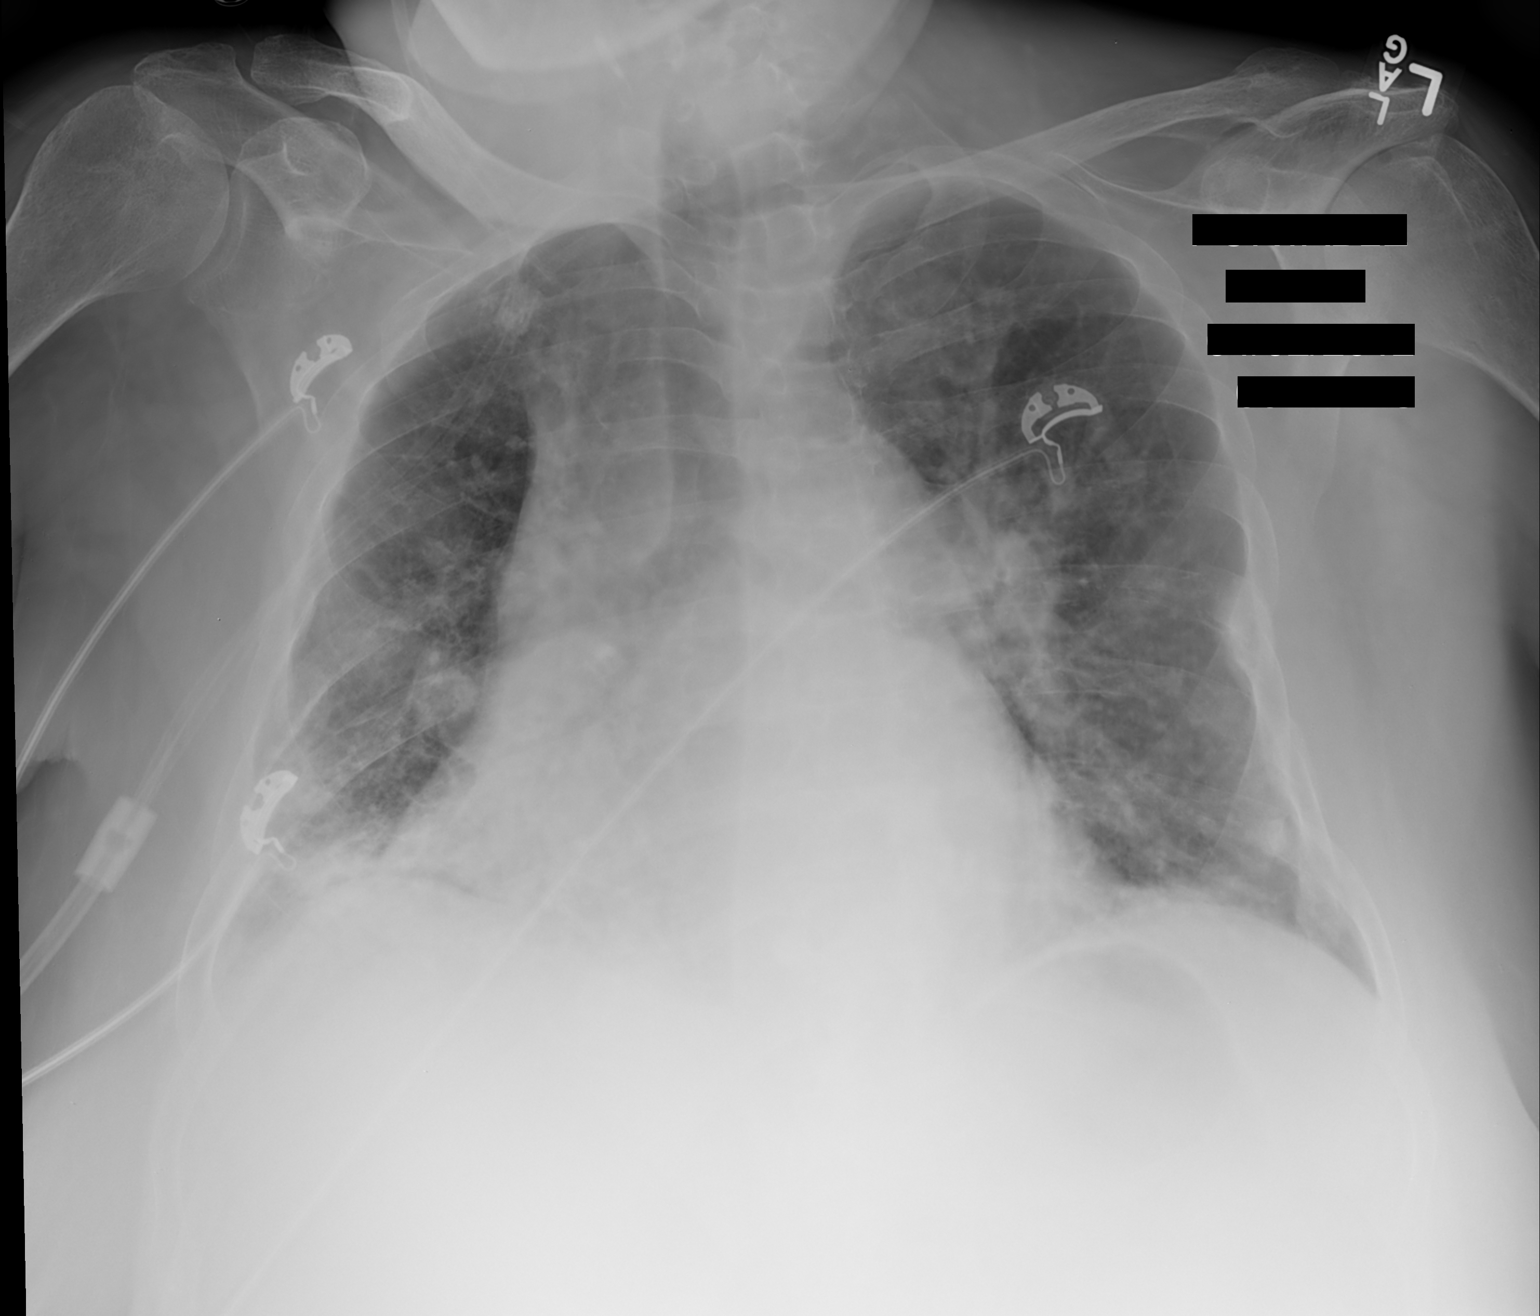

[1 of 1 positions shown; findings below may reference images not displayed]

FINDINGS: Heart size is enlarged.  This is unchanged from prior
exam.  Patchy airspace consolidation within the right base is again
noted and appears unchanged from CT dated 08/21/2010.  Left base
atelectasis or early infiltrate is new from prior exam.
IMPRESSION: 1.  No change in aeration the left lung compared with prior study.
2.  New left base opacities consistent with atelectasis and/or
infiltrate.

## 2013-02-08 IMAGING — CR DG CHEST 2V
2 series · 2 of 2 positions shown · non-contrast
Comparison: 09/07/2010 and 08/14/2010.

CLINICAL DATA: Cough and shortness of breath.

CHEST - 2 VIEW

[w chest pa]
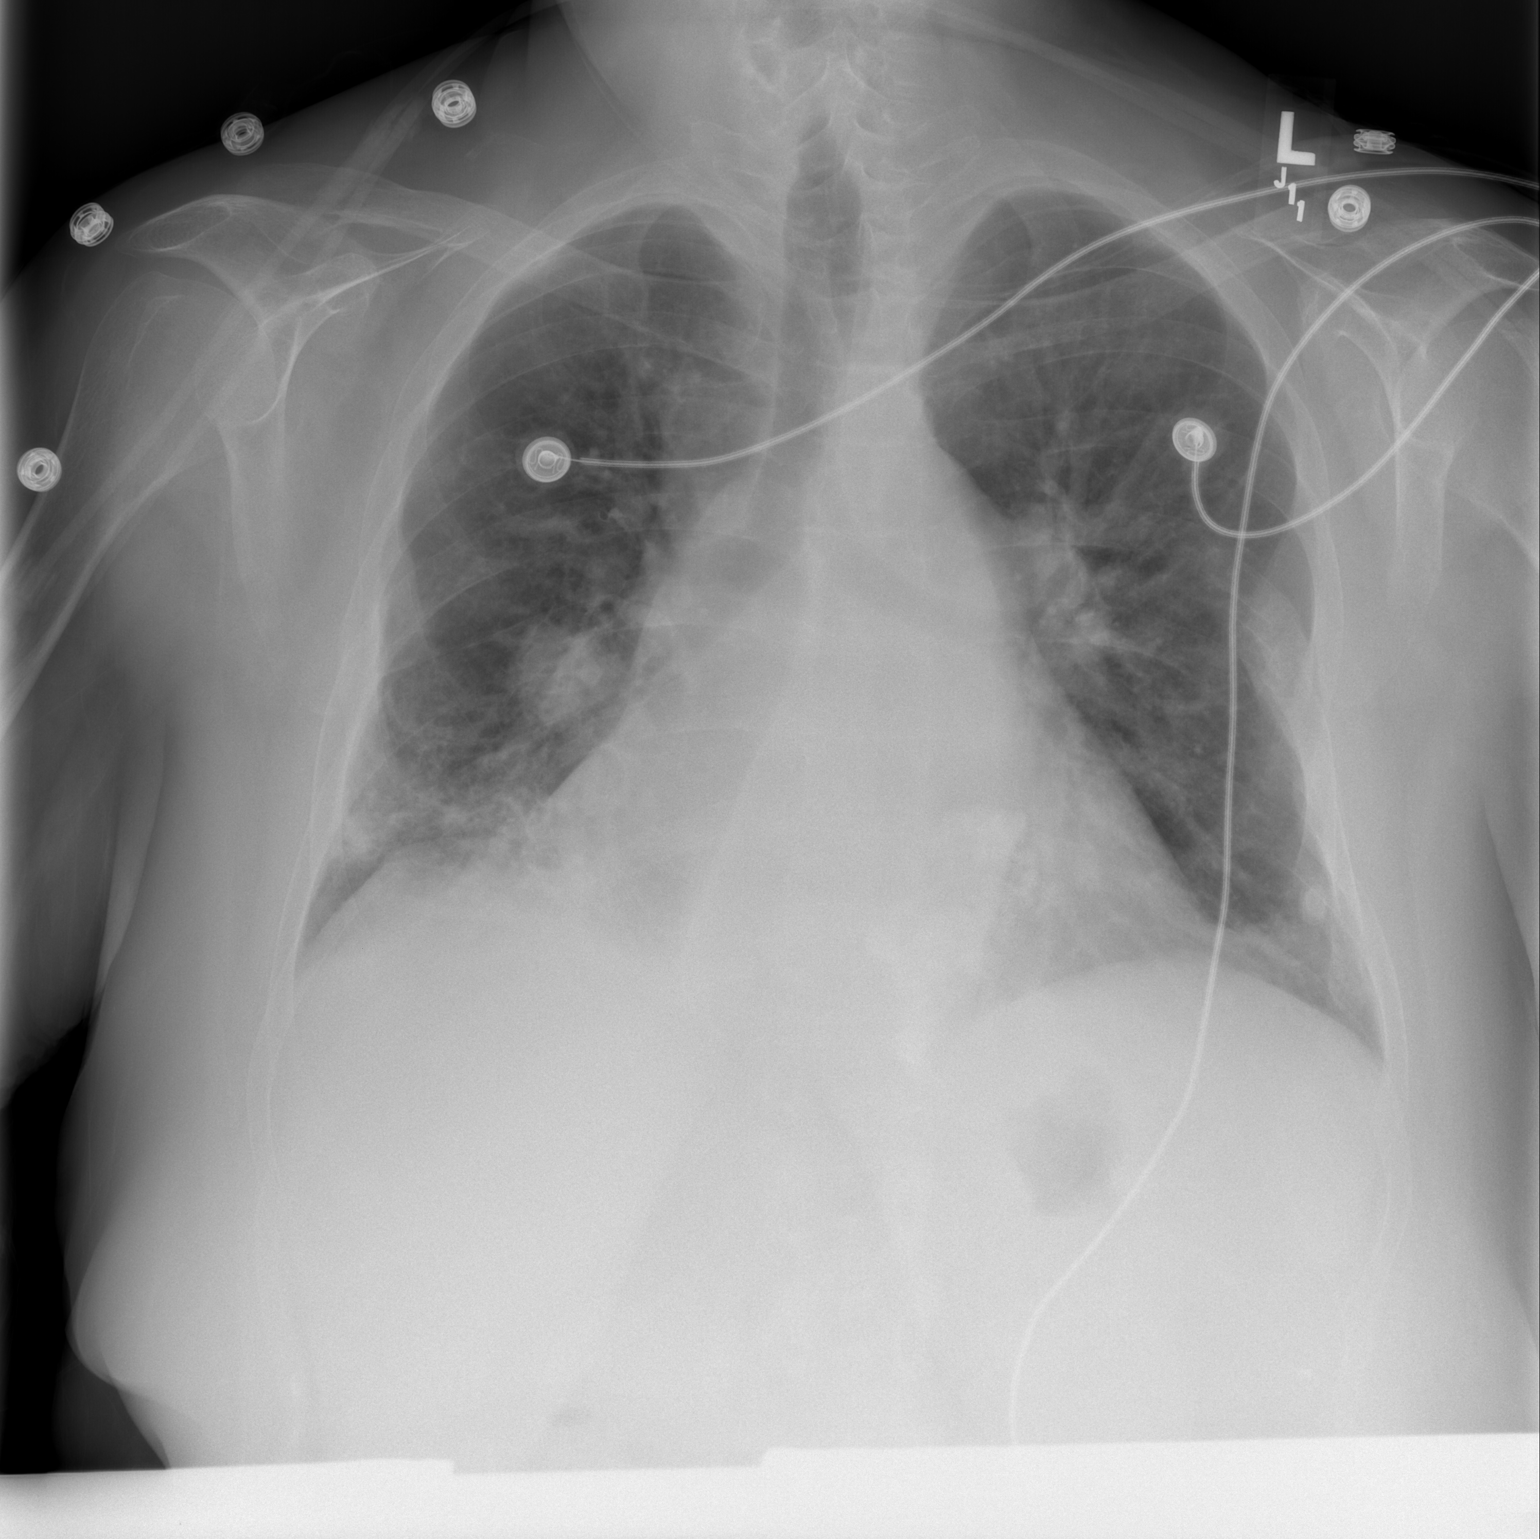

[w chest lat]
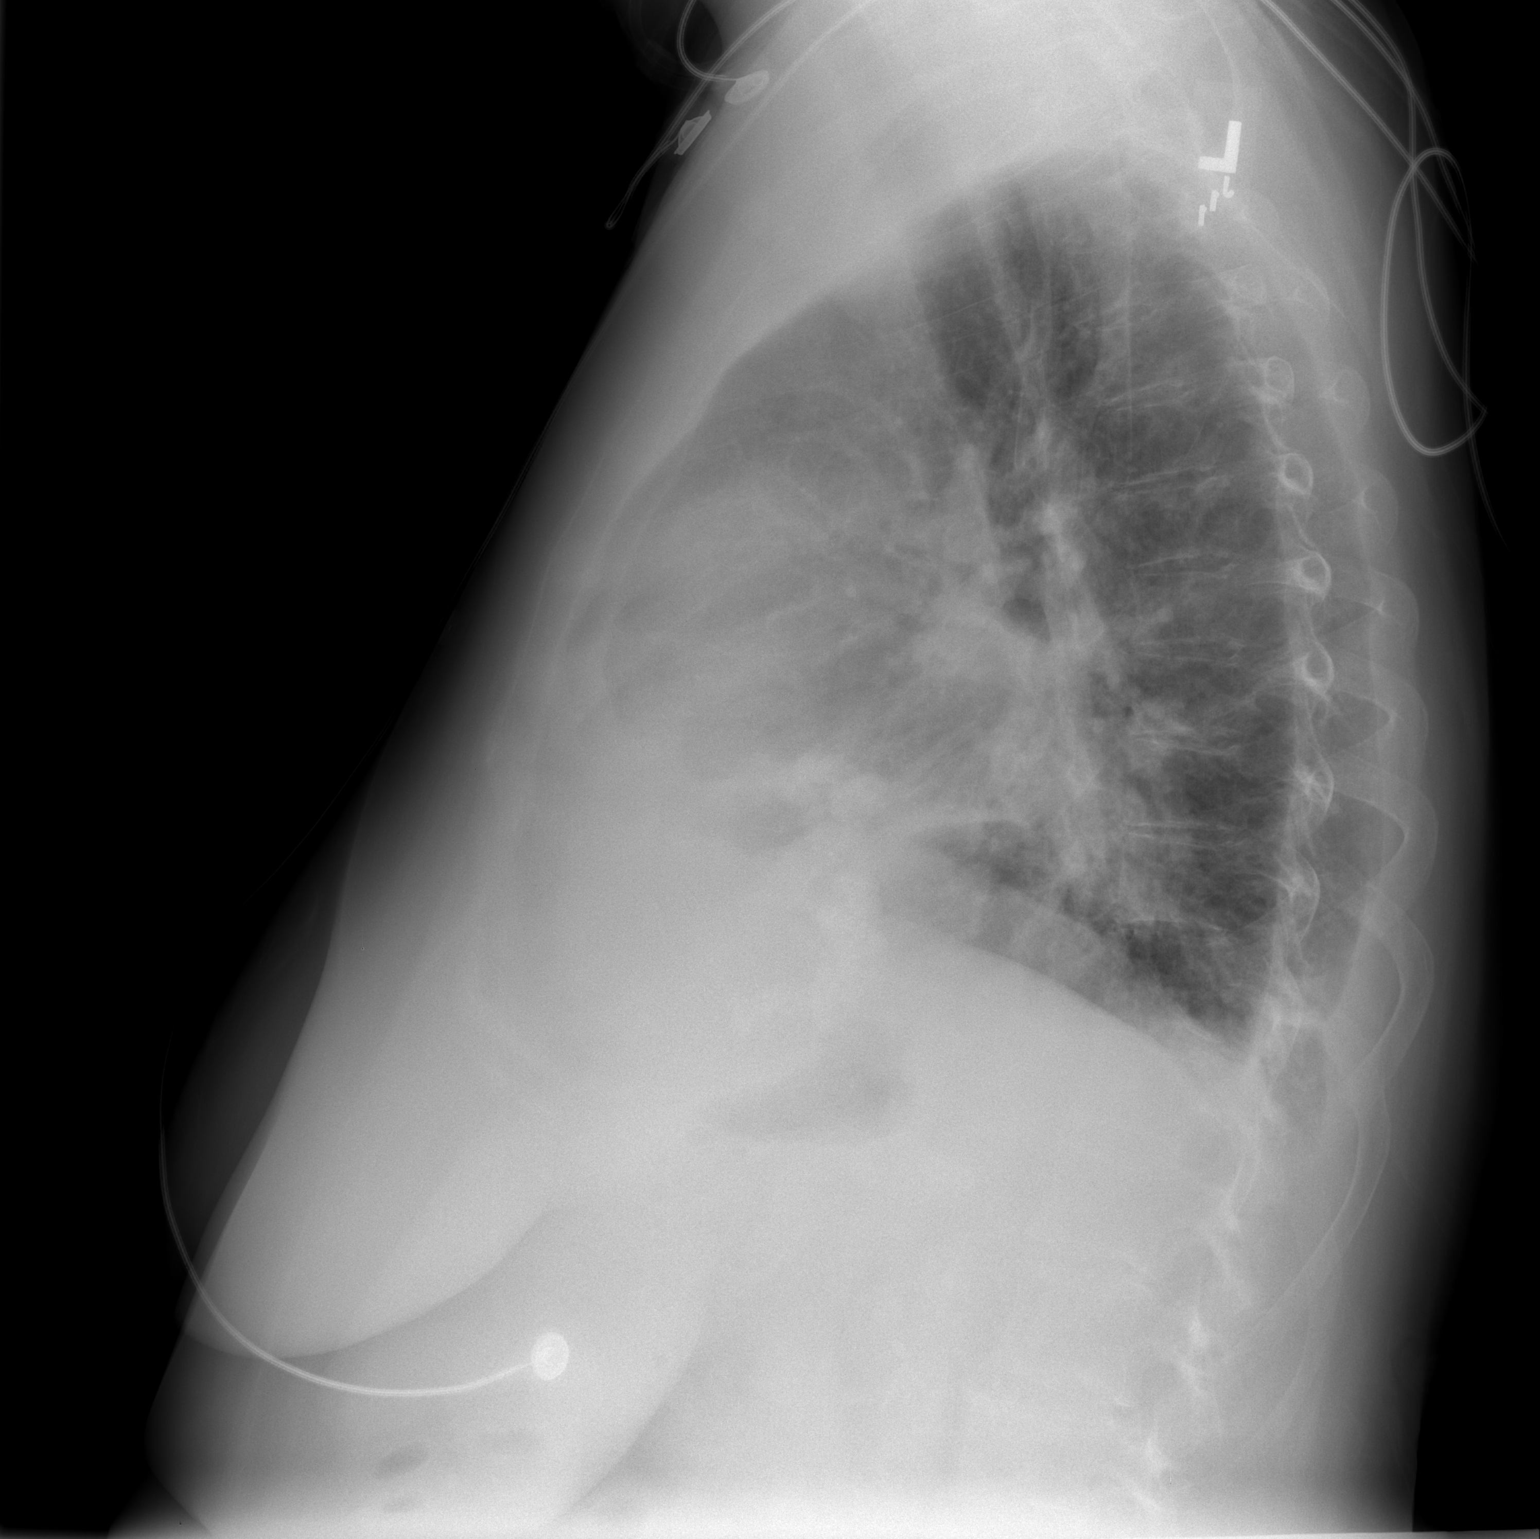

[2 of 2 positions shown; findings below may reference images not displayed]

FINDINGS: The cardiopericardial silhouette is enlarged.  Airspace
disease at the right base persists, but is decreased in the
interval.  Soft tissue fullness in the right hilum also appears
slightly decreased in the interval.  Prominent mitral annular
mineralization is again noted. Telemetry leads overlie the chest.
IMPRESSION: Slight interval improvement in airspace disease at the right lung
base with persistent fullness in the right hilum.  These areas were
better assessed by CT scan from 08/21/2010.

## 2013-02-08 IMAGING — CT CT MAXILLOFACIAL W/O CM
1 series · 15 of 30 positions shown, 19 images · non-contrast
Comparison: 06/09/2009.

CLINICAL DATA: Persistent cough.  History of lupus.  Elevated white
count.  Question sinusitis.

CT MAXILLOFACIAL WITHOUT CONTRAST
TECHNIQUE: Multidetector CT imaging of the maxillofacial
structures was performed. Multiplanar CT image reconstructions were
also generated.

[Series 3: ltd sinuses 3.0 h40s st · axial · 0.32mm/px · z∈[-120,-24]mm · 15 of 36 slices shown, 19 images]
[im 2/36  brain]
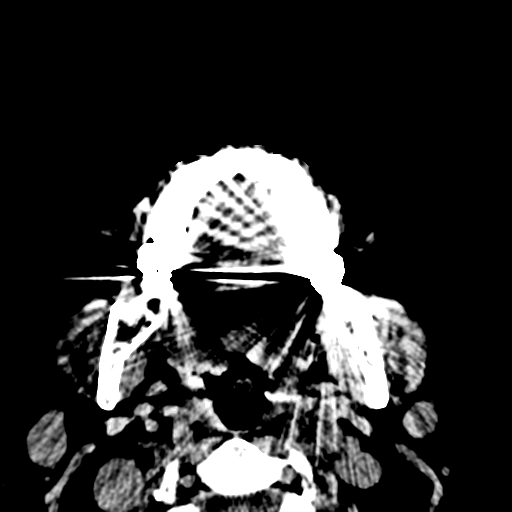
[im 2/36  bone]
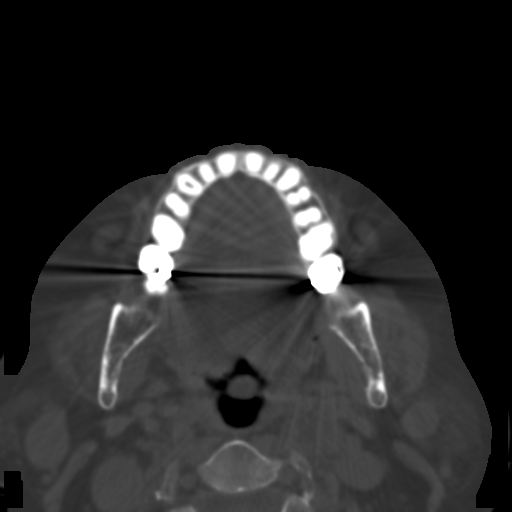
[im 4/36  bone]
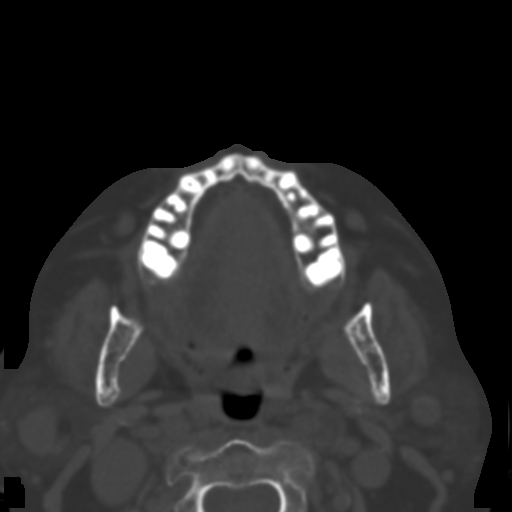
[im 7/36  bone]
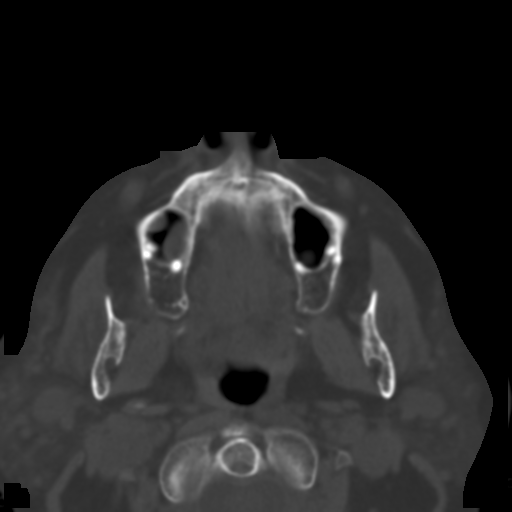
[im 9/36  bone]
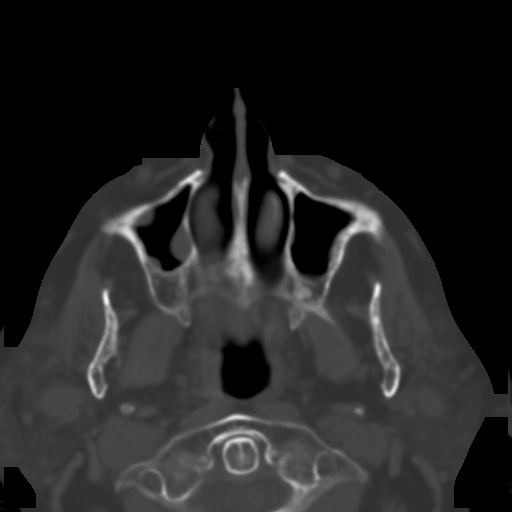
[im 11/36  brain]
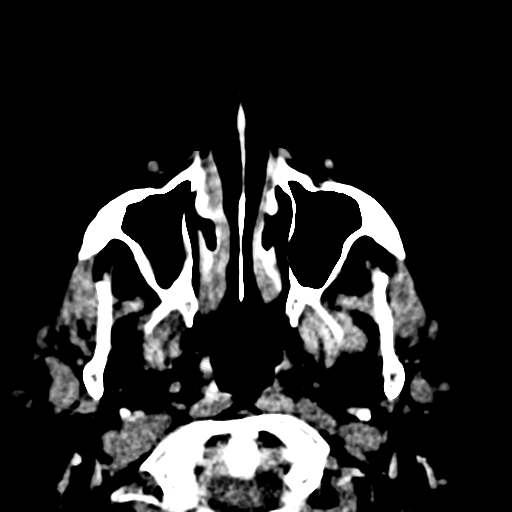
[im 11/36  bone]
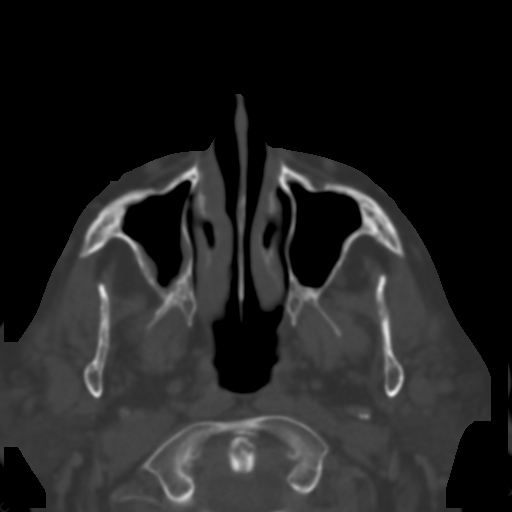
[im 14/36  bone]
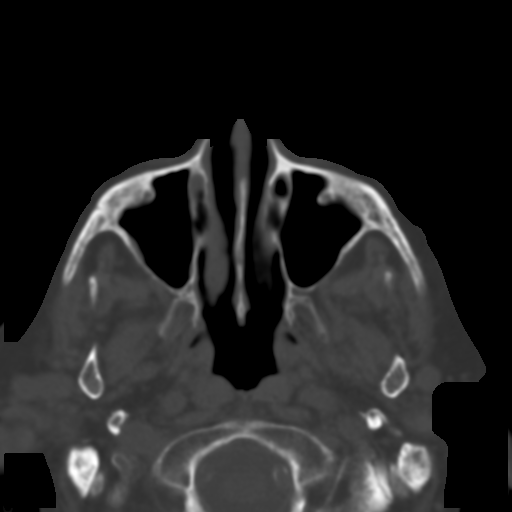
[im 16/36  bone]
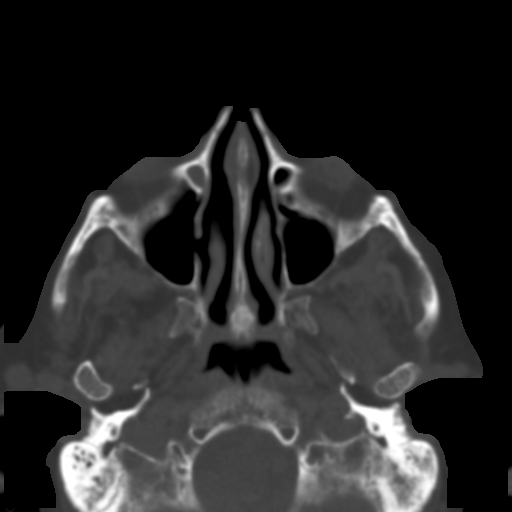
[im 19/36  bone]
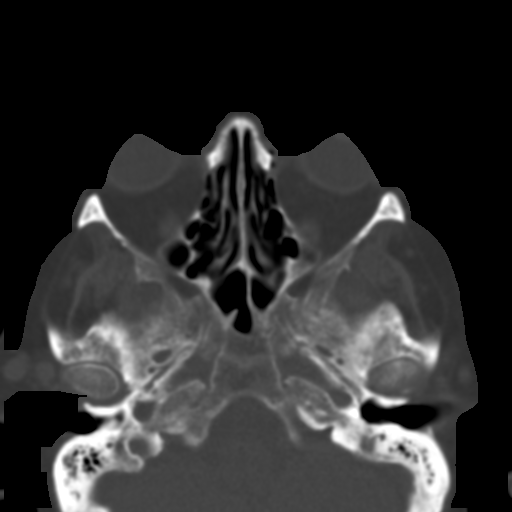
[im 20/36  brain]
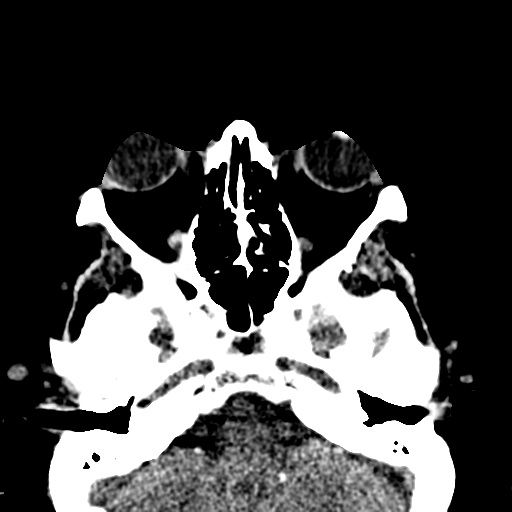
[im 20/36  bone]
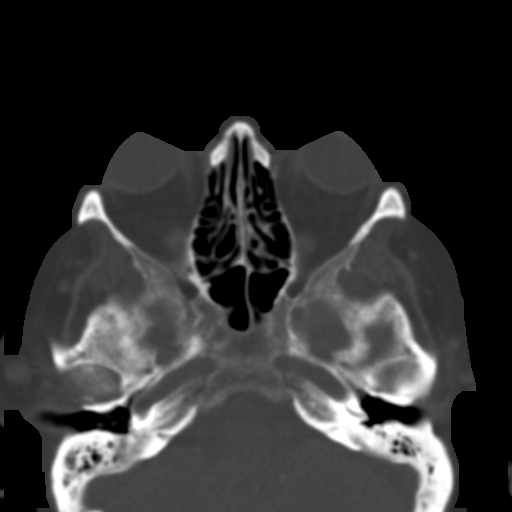
[im 22/36  bone]
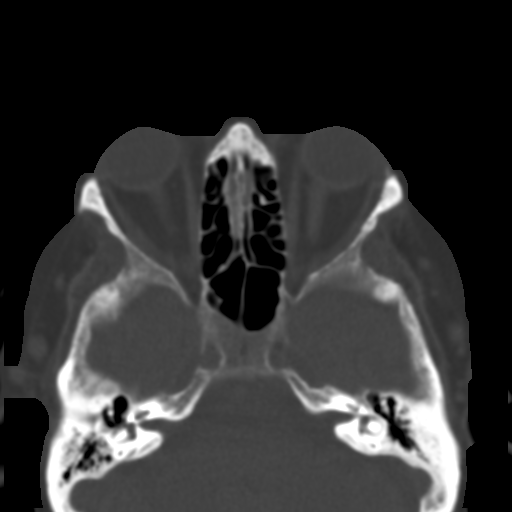
[im 25/36  bone]
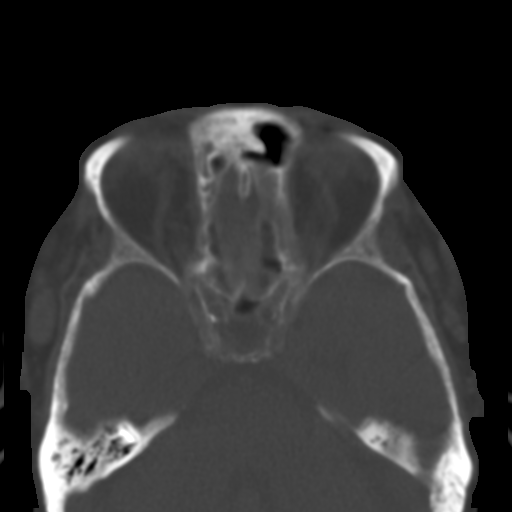
[im 27/36  bone]
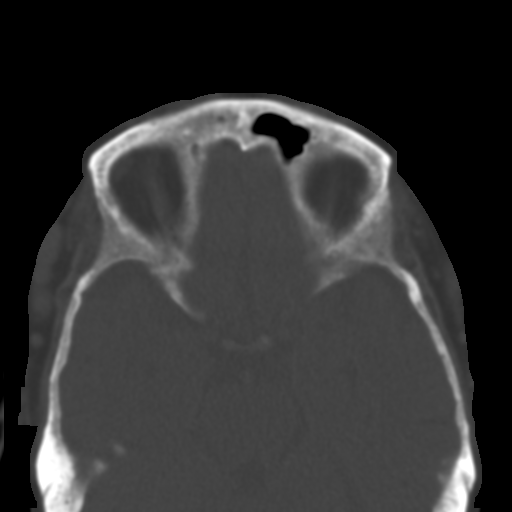
[im 29/36  brain]
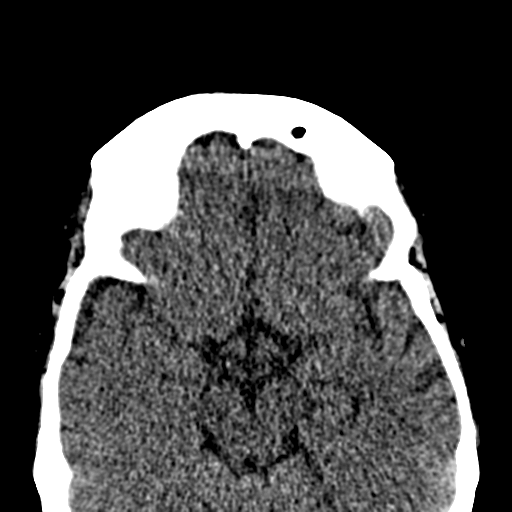
[im 29/36  bone]
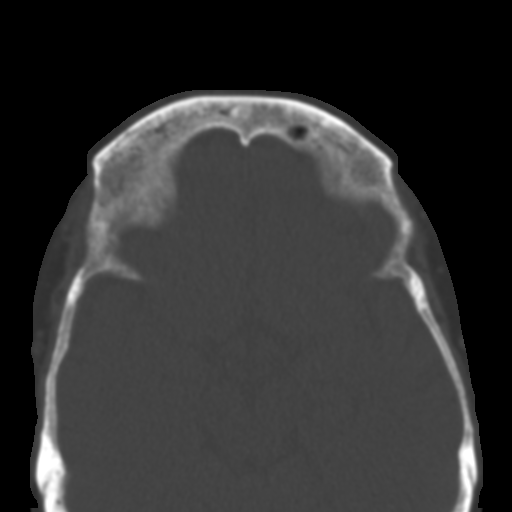
[im 32/36  bone]
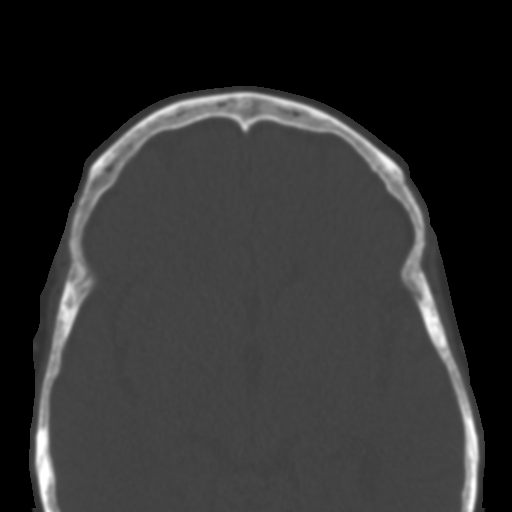
[im 34/36  bone]
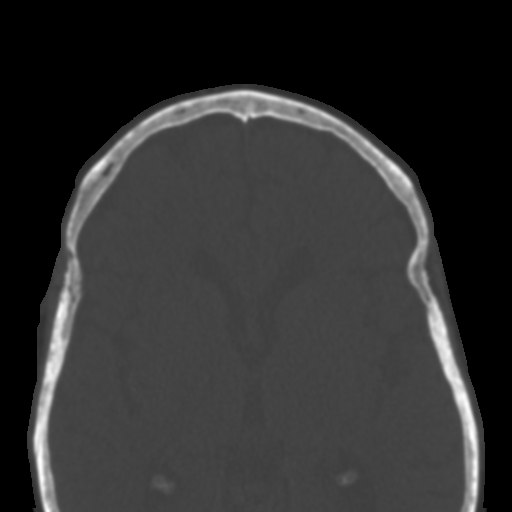

[15 of 30 positions shown; findings below may reference images not displayed]

FINDINGS: Mild slightly polypoid appearing mucosal thickening right
maxillary sinus.  Very mild mucosal thickening inferior aspect left
maxillary sinus.

Partial opacification anterior left ethmoid sinus air cell.

Under developed right frontal sinus.  Clear left frontal sinus.

Clear sphenoid sinus air cells.

Mild partial opacification right mastoid air cells and possibly
inferior left mastoid air cells. No mass noted in the region of
posterior-superior nasopharynx causing eustachian tube dysfunction.
Middle ear cavities are clear.

Intracranial atrophy.

Advanced atherosclerotic type changes with calcified internal
carotid artery cavernous segment and vertebral arteries at the
level of the foramen magnum.

Prominent subcutaneous vessels greater on the right.  Orbital
structures reveal lack of lens on the right.
IMPRESSION: Mild slightly polypoid appearing mucosal thickening right
maxillary sinus.  Very mild mucosal thickening inferior aspect left
maxillary sinus.

Partial opacification anterior left ethmoid sinus air cell.

Under developed right frontal sinus.  Clear left frontal sinus.

Clear sphenoid sinus air cells.

Mild partial opacification right mastoid air cells and possibly
inferior left mastoid air cells.  Middle ear cavities are clear.

Intracranial atrophy.  Advanced atherosclerotic type changes.

## 2013-12-14 NOTE — Telephone Encounter (Signed)
Close encounter
